# Patient Record
Sex: Female | Born: 1990 | Race: White | Hispanic: No | Marital: Single | Smoking: Current every day smoker
Health system: Southern US, Community
[De-identification: ages and names within clinical notes are randomized; demographics above are authoritative.]

## PROBLEM LIST (undated history)

## (undated) DIAGNOSIS — B1920 Unspecified viral hepatitis C without hepatic coma: Secondary | ICD-10-CM

## (undated) HISTORY — DX: Unspecified viral hepatitis C without hepatic coma: B19.20

---

## 2010-03-29 ENCOUNTER — Ambulatory Visit: Payer: Self-pay | Admitting: Gastroenterology

## 2010-05-17 ENCOUNTER — Ambulatory Visit: Payer: Self-pay | Admitting: Gastroenterology

## 2010-06-07 ENCOUNTER — Ambulatory Visit: Payer: Self-pay | Admitting: Gastroenterology

## 2010-06-28 ENCOUNTER — Ambulatory Visit: Payer: Self-pay | Admitting: Gastroenterology

## 2010-07-12 ENCOUNTER — Ambulatory Visit: Payer: Self-pay | Admitting: Gastroenterology

## 2010-08-09 ENCOUNTER — Ambulatory Visit: Payer: Self-pay | Admitting: Gastroenterology

## 2010-09-06 ENCOUNTER — Ambulatory Visit: Payer: Self-pay | Admitting: Gastroenterology

## 2010-10-04 ENCOUNTER — Ambulatory Visit: Payer: Self-pay | Admitting: Gastroenterology

## 2021-01-17 ENCOUNTER — Other Ambulatory Visit: Payer: Self-pay

## 2021-01-18 ENCOUNTER — Ambulatory Visit (INDEPENDENT_AMBULATORY_CARE_PROVIDER_SITE_OTHER): Payer: Medicaid Other | Admitting: Internal Medicine

## 2021-01-18 ENCOUNTER — Encounter: Payer: Self-pay | Admitting: Internal Medicine

## 2021-01-18 VITALS — BP 116/78 | HR 84 | Ht 64.0 in | Wt 116.2 lb

## 2021-01-18 DIAGNOSIS — E0590 Thyrotoxicosis, unspecified without thyrotoxic crisis or storm: Secondary | ICD-10-CM

## 2021-01-18 DIAGNOSIS — E0500 Thyrotoxicosis with diffuse goiter without thyrotoxic crisis or storm: Secondary | ICD-10-CM | POA: Diagnosis not present

## 2021-01-18 LAB — CBC WITH DIFFERENTIAL/PLATELET
Basophils Absolute: 0 10*3/uL (ref 0.0–0.1)
Basophils Relative: 0.5 % (ref 0.0–3.0)
Eosinophils Absolute: 0.1 10*3/uL (ref 0.0–0.7)
Eosinophils Relative: 1 % (ref 0.0–5.0)
HCT: 41.4 % (ref 36.0–46.0)
Hemoglobin: 14.1 g/dL (ref 12.0–15.0)
Lymphocytes Relative: 31.2 % (ref 12.0–46.0)
Lymphs Abs: 2.5 10*3/uL (ref 0.7–4.0)
MCHC: 34 g/dL (ref 30.0–36.0)
MCV: 86.2 fl (ref 78.0–100.0)
Monocytes Absolute: 0.5 10*3/uL (ref 0.1–1.0)
Monocytes Relative: 6 % (ref 3.0–12.0)
Neutro Abs: 5 10*3/uL (ref 1.4–7.7)
Neutrophils Relative %: 61.3 % (ref 43.0–77.0)
Platelets: 245 10*3/uL (ref 150.0–400.0)
RBC: 4.8 Mil/uL (ref 3.87–5.11)
RDW: 12.2 % (ref 11.5–15.5)
WBC: 8.1 10*3/uL (ref 4.0–10.5)

## 2021-01-18 LAB — COMPREHENSIVE METABOLIC PANEL
ALT: 17 U/L (ref 0–35)
AST: 15 U/L (ref 0–37)
Albumin: 4.4 g/dL (ref 3.5–5.2)
Alkaline Phosphatase: 65 U/L (ref 39–117)
BUN: 11 mg/dL (ref 6–23)
CO2: 28 mEq/L (ref 19–32)
Calcium: 9.1 mg/dL (ref 8.4–10.5)
Chloride: 106 mEq/L (ref 96–112)
Creatinine, Ser: 0.55 mg/dL (ref 0.40–1.20)
GFR: 122.2 mL/min (ref >60.00)
Glucose, Bld: 97 mg/dL (ref 70–99)
Potassium: 4.4 mEq/L (ref 3.5–5.1)
Sodium: 137 mEq/L (ref 135–145)
Total Bilirubin: 0.3 mg/dL (ref 0.2–1.2)
Total Protein: 7.1 g/dL (ref 6.0–8.3)

## 2021-01-18 LAB — T4, FREE: Free T4: 0.48 ng/dL — ABNORMAL LOW (ref 0.60–1.60)

## 2021-01-18 LAB — TSH: TSH: <0.01 u[IU]/mL — ABNORMAL LOW (ref 0.35–4.50)

## 2021-01-18 NOTE — Patient Instructions (Signed)
-   STOP PTU  - We recommend that you follow these hyperthyroidism instructions at home:  1) Take Methimazole   If you develop severe sore throat with high fevers OR develop unexplained yellowing of your skin, eyes, under your tongue, severe abdominal pain with nausea or vomiting --> then please get evaluated immediately.  2) Atenolol 25 mg daily

## 2021-01-18 NOTE — Progress Notes (Signed)
Name: Ariel Adams  MRN/ DOB: [id number], 04/04/1991    Age/ Sex: 30 y.o., female    PCP: Yvonne Kendall, NP   Reason for Endocrinology Evaluation: Hyperthyroidism     Date of Initial Endocrinology Evaluation: 01/18/2021     HPI: Ariel Adams is a 30 y.o. female with a past medical history of Graves' Disease . The patient presented for initial endocrinology clinic visit on 01/18/2021 for consultative assistance with her hyperthyroidism.   She has been diagnosed with hyperthyroidism secondary to Graves' disease in 02/2020. She presented with thyromegaly as well as eye symptoms with bulging  As well as weight loss.   She was started on PTU in 02/2020 .   Had ectopic pregnancy prior to her diagnosis    LMP 01/15/2021 - its regular   Today she endorses stable weight Palpitations have resolved  Has tremors   Neck swelling has decreased since being on PTU    Has strong FH of thyroid disease ( maternal grand mother)      HOME ENDOCRINE MEDICATIONS: PTU 50 mg , two tabs TID - has been taking 2 tabs BID  Atenolol 25 mg daily      HISTORY:  Past Medical History:  Past Medical History:  Diagnosis Date   Hepatitis C    10 years ago    Past Surgical History:    Social History:  reports that she has been smoking cigarettes. She has never used smokeless tobacco. She reports previous alcohol use.  Family History: family history is not on file.   HOME MEDICATIONS: Allergies as of 01/18/2021   No Known Allergies     Medication List       Accurate as of January 18, 2021 11:13 AM. If you have any questions, ask your nurse or doctor.        atenolol 25 MG tablet Commonly known as: TENORMIN TAKE 1 TABLET BY MOUTH ONCE DAILY FOR HEART RATE   propylthiouracil 50 MG tablet Commonly known as: PTU Take 100 mg by mouth 3 (three) times daily.         REVIEW OF SYSTEMS: A comprehensive ROS was conducted with the patient and is negative except as per HPI      OBJECTIVE:  VS: BP 116/78    Pulse 84    Ht 5\' 4"  (1.626 m)    Wt 116 lb 4 oz (52.7 kg)    LMP 01/18/2021    SpO2 99%    BMI 19.95 kg/m    Wt Readings from Last 3 Encounters:  01/18/21 116 lb 4 oz (52.7 kg)     EXAM: General: Pt appears well and is in NAD  Eyes: External eye exam normal without stare, lid lag or exophthalmos.  EOM intact.  PERRL.  Neck: General: Supple without adenopathy. Thyroid: Thyroid size enlarged ~70 grams . +  thyroid bruit.  Lungs: Clear with good BS bilat with no rales, rhonchi, or wheezes  Heart: Auscultation: RRR.  Abdomen: Normoactive bowel sounds, soft, nontender, without masses or organomegaly palpable  Extremities:  BL LE: No pretibial edema normal ROM and strength.  Skin: Hair: Texture and amount normal with gender appropriate distribution Skin Inspection: No rashes Skin Palpation: Skin temperature, texture, and thickness normal to palpation  Neuro: Cranial nerves: II - XII grossly intact  Motor: Normal strength throughout DTRs: 2+ and symmetric in UE without delay in relaxation phase  Mental Status: Judgment, insight: Intact Orientation: Oriented to time, place, and person Mood and  affect: No depression, anxiety, or agitation     DATA REVIEWED:   Results for LEILANNI, HALVORSON (MRN [id number]) as of 01/19/2021 12:55  Ref. Range 01/18/2021 11:24  Sodium Latest Ref Range: 135 - 145 mEq/L 137  Potassium Latest Ref Range: 3.5 - 5.1 mEq/L 4.4  Chloride Latest Ref Range: 96 - 112 mEq/L 106  CO2 Latest Ref Range: 19 - 32 mEq/L 28  Glucose Latest Ref Range: 70 - 99 mg/dL 97  BUN Latest Ref Range: 6 - 23 mg/dL 11  Creatinine Latest Ref Range: 0.40 - 1.20 mg/dL 9.37  Calcium Latest Ref Range: 8.4 - 10.5 mg/dL 9.1  Alkaline Phosphatase Latest Ref Range: 39 - 117 U/L 65  Albumin Latest Ref Range: 3.5 - 5.2 g/dL 4.4  AST Latest Ref Range: 0 - 37 U/L 15  ALT Latest Ref Range: 0 - 35 U/L 17  Total Protein Latest Ref Range: 6.0 - 8.3 g/dL 7.1  Total  Bilirubin Latest Ref Range: 0.2 - 1.2 mg/dL 0.3  GFR Latest Ref Range: >60.00 mL/min 122.20  WBC Latest Ref Range: 4.0 - 10.5 K/uL 8.1  RBC Latest Ref Range: 3.87 - 5.11 Mil/uL 4.80  Hemoglobin Latest Ref Range: 12.0 - 15.0 g/dL 90.2  HCT Latest Ref Range: 36.0 - 46.0 % 41.4  MCV Latest Ref Range: 78.0 - 100.0 fl 86.2  MCHC Latest Ref Range: 30.0 - 36.0 g/dL 40.9  RDW Latest Ref Range: 11.5 - 15.5 % 12.2  Platelets Latest Ref Range: 150.0 - 400.0 K/uL 245.0  Neutrophils Latest Ref Range: 43.0 - 77.0 % 61.3  Lymphocytes Latest Ref Range: 12.0 - 46.0 % 31.2  Monocytes Relative Latest Ref Range: 3.0 - 12.0 % 6.0  Eosinophil Latest Ref Range: 0.0 - 5.0 % 1.0  Basophil Latest Ref Range: 0.0 - 3.0 % 0.5  NEUT# Latest Ref Range: 1.4 - 7.7 K/uL 5.0  Lymphocyte # Latest Ref Range: 0.7 - 4.0 K/uL 2.5  Monocyte # Latest Ref Range: 0.1 - 1.0 K/uL 0.5  Eosinophils Absolute Latest Ref Range: 0.0 - 0.7 K/uL 0.1  Basophils Absolute Latest Ref Range: 0.0 - 0.1 K/uL 0.0  TSH Latest Ref Range: 0.35 - 4.50 uIU/mL <0.01 Repeated and verified X2. (L)  T4,Free(Direct) Latest Ref Range: 0.60 - 1.60 ng/dL 7.35 (L)    ASSESSMENT/PLAN/RECOMMENDATIONS:   1. Hyperthyroidism Secondary to Graves' Disease   -- Historically there has been imperfect adherence to thionamide therapy  - We discussed that Graves' Disease is a result of an autoimmune condition involving the thyroid.    - We discussed with pt the benefits of methimazole in the Tx of hyperthyroidism, as well as the possible side effects/complications of anti-thyroid drug Tx (specifically detailing the rare, but serious side effect of agranulocytosis). She was informed of need for regular thyroid function monitoring while on methimazole to ensure appropriate dosage without over-treatment. As well, we discussed the possible side effects of methimazole including the chance of rash, the small chance of liver irritation/juandice and the <=1 in 300-400 chance of  sudden onset agranulocytosis.  We discussed importance of going to ED promptly (and stopping methimazole) if shewere to develop significant fever with severe sore throat of other evidence of acute infection.      We extensively discussed the various treatment options for hyperthyroidism and Graves disease including ablation therapy with radioactive iodine versus antithyroid drug treatment versus surgical therapy.   We discussed the various possible benefits versus side effects of the various therapies.   I carefully explained  to the patient that one of the consequences of I-131 ablation treatment would likely be permanent hypothyroidism which would require long-term replacement therapy with LT4.   - At this time the pt is interested in pursing thioamide therapy, will switch to Methimazole , she understands PTU is preferred during first trimeter of pregnancy but since she is not planning on conceiving , will switch . I have advised her to use OCP's to avoid pregnancy at this time      Medications : STOP PTU  Start Methimazole 10 mg BID     2. Grave's Disease:   - No extra-thyroidal manifestations of graves' disease    3. Tobacco Abuse:   - Counseled about the importance of smoking cessation as tobacco use decreased her chances of going to remission and increased her risk of developing Graves' orbitopathy    F/U in 3 months  Signed electronically by: Lyndle Herrlich, MD  [facility] Endocrinology  [facility] [address] [place] [place], [place] [address] Phone: [phone] FAX: [phone]   CC: Yvonne Kendall, NP [address] [place] [address] Phone: [phone] Fax: [phone]   Return to Endocrinology clinic as below: No future appointments.

## 2021-01-19 DIAGNOSIS — E0590 Thyrotoxicosis, unspecified without thyrotoxic crisis or storm: Secondary | ICD-10-CM | POA: Insufficient documentation

## 2021-01-19 DIAGNOSIS — E0500 Thyrotoxicosis with diffuse goiter without thyrotoxic crisis or storm: Secondary | ICD-10-CM | POA: Insufficient documentation

## 2021-01-19 MED ORDER — METHIMAZOLE 10 MG PO TABS: 10.0000 mg | ORAL_TABLET | Freq: Two times a day (BID) | ORAL | 6 refills | Status: DC

## 2021-01-21 LAB — TRAB (TSH RECEPTOR BINDING ANTIBODY): TRAB: 24.64 IU/L — ABNORMAL HIGH (ref <=2.00)

## 2021-03-19 ENCOUNTER — Encounter: Payer: Self-pay | Admitting: Internal Medicine

## 2021-03-20 NOTE — Telephone Encounter (Signed)
Pt called regarding the MyChart message below. She thinks the Dr misunderstood her as she has been taking both of those medications for a while. Since she hasn't had any refills she hasn't been able to take them and she can tell a difference in how she feels. She also states since she hasn't taken them her heart rate has been averaging around 100-102 so she prefers not to stop any of those medications. She would like a refill for her atenolol and metformin. Please send to:  [facility] [address], [place] - [address] Phone:  [phone]  Fax:  [phone]     Ph# [phone]

## 2021-03-23 NOTE — Telephone Encounter (Signed)
Pt called back again regarding the notes below. She is trying to get a refill on her medication, she has been out for a week and is starting to get concerned whether she should go to her PCP for this.  [facility] [address] [place] - [address] Phone:  [phone]  Fax:  [phone]

## 2021-03-26 ENCOUNTER — Telehealth: Payer: Self-pay

## 2021-03-26 NOTE — Telephone Encounter (Signed)
Dr Lonzo Cloud stated that patient will need an appointment.

## 2021-03-26 NOTE — Telephone Encounter (Signed)
Patient is very unhappy that provider will not refill her medication, she does not agree with the provider saying she needs to stop taking the medication. States her HR is going through the roof. She doesn't agree one bit and is very upset and states she will be complaining to higher up as well as she is no longer coming back     Please advise    Medication : atenolol (TENORMIN) 25 MG tablet

## 2021-04-19 ENCOUNTER — Encounter: Payer: Self-pay | Admitting: Internal Medicine

## 2021-04-19 ENCOUNTER — Other Ambulatory Visit: Payer: Self-pay

## 2021-04-19 ENCOUNTER — Ambulatory Visit (INDEPENDENT_AMBULATORY_CARE_PROVIDER_SITE_OTHER): Payer: Medicaid Other | Admitting: Internal Medicine

## 2021-04-19 VITALS — BP 126/82 | HR 83 | Ht 64.0 in | Wt 123.1 lb

## 2021-04-19 DIAGNOSIS — E0500 Thyrotoxicosis with diffuse goiter without thyrotoxic crisis or storm: Secondary | ICD-10-CM | POA: Diagnosis not present

## 2021-04-19 DIAGNOSIS — E0590 Thyrotoxicosis, unspecified without thyrotoxic crisis or storm: Secondary | ICD-10-CM

## 2021-04-19 LAB — COMPREHENSIVE METABOLIC PANEL
ALT: 17 U/L (ref 0–35)
AST: 18 U/L (ref 0–37)
Albumin: 4.4 g/dL (ref 3.5–5.2)
Alkaline Phosphatase: 70 U/L (ref 39–117)
BUN: 9 mg/dL (ref 6–23)
CO2: 27 mEq/L (ref 19–32)
Calcium: 9.2 mg/dL (ref 8.4–10.5)
Chloride: 103 mEq/L (ref 96–112)
Creatinine, Ser: 0.79 mg/dL (ref 0.40–1.20)
GFR: 99.55 mL/min (ref >60.00)
Glucose, Bld: 71 mg/dL (ref 70–99)
Potassium: 4.1 mEq/L (ref 3.5–5.1)
Sodium: 137 mEq/L (ref 135–145)
Total Bilirubin: 0.3 mg/dL (ref 0.2–1.2)
Total Protein: 7.4 g/dL (ref 6.0–8.3)

## 2021-04-19 LAB — CBC WITH DIFFERENTIAL/PLATELET
Basophils Absolute: 0 10*3/uL (ref 0.0–0.1)
Basophils Relative: 0.5 % (ref 0.0–3.0)
Eosinophils Absolute: 0.1 10*3/uL (ref 0.0–0.7)
Eosinophils Relative: 1.6 % (ref 0.0–5.0)
HCT: 42.5 % (ref 36.0–46.0)
Hemoglobin: 14.6 g/dL (ref 12.0–15.0)
Lymphocytes Relative: 37 % (ref 12.0–46.0)
Lymphs Abs: 3.1 10*3/uL (ref 0.7–4.0)
MCHC: 34.4 g/dL (ref 30.0–36.0)
MCV: 88.7 fl (ref 78.0–100.0)
Monocytes Absolute: 0.4 10*3/uL (ref 0.1–1.0)
Monocytes Relative: 4.4 % (ref 3.0–12.0)
Neutro Abs: 4.7 10*3/uL (ref 1.4–7.7)
Neutrophils Relative %: 56.5 % (ref 43.0–77.0)
Platelets: 288 10*3/uL (ref 150.0–400.0)
RBC: 4.79 Mil/uL (ref 3.87–5.11)
RDW: 14.2 % (ref 11.5–15.5)
WBC: 8.3 10*3/uL (ref 4.0–10.5)

## 2021-04-19 LAB — TSH: TSH: 9.88 u[IU]/mL — ABNORMAL HIGH (ref 0.35–4.50)

## 2021-04-19 LAB — T4, FREE: Free T4: 0.21 ng/dL — ABNORMAL LOW (ref 0.60–1.60)

## 2021-04-19 NOTE — Patient Instructions (Signed)
-   We recommend that you follow these hyperthyroidism instructions at home:  1) Take Methimazole 10 mg TWO tablets daily for now   If you develop severe sore throat with high fevers OR develop unexplained yellowing of your skin, eyes, under your tongue, severe abdominal pain with nausea or vomiting --> then please get evaluated immediately.

## 2021-04-19 NOTE — Progress Notes (Signed)
Name: Ariel Adams  MRN/ DOB: [id number], 09-11-1991    Age/ Sex: 30 y.o., female     PCP: Yvonne Kendall, NP   Reason for Endocrinology Evaluation: Hyperthyroidism     Initial Endocrinology Clinic Visit: 01/18/2021    PATIENT IDENTIFIER: Ariel Adams is a 30 y.o., female with a past medical history of Graves' disease. She has followed with  Endocrinology clinic since 01/18/2021 for consultative assistance with management of her Graves' disease    HISTORICAL SUMMARY:  She has been diagnosed with hyperthyroidism secondary to Graves' disease in 02/2020. She presented with thyromegaly as well as eye symptoms with bulging  As well as weight loss.   She was started on PTU in 02/2020 but was switched to methimazole by  12/2020 with the understanding that she will not be conceiving  Had ectopic pregnancy prior to her diagnosis   Has strong FH of thyroid disease ( maternal grand mother)   SUBJECTIVE:    Today (04/19/2021):  [name] is here for her hyperthyroidism.    She has been on methimazole  , she is trying to get better with compliance  She has been noted with weight gain  Denies constipation  Left side of the neck decreased in size   Denies burning or itching in the eyes  Using condoms   LMP 2 weeks , regular     Methimazole 10 mg BID     HISTORY:  Past Medical History:  Past Medical History:  Diagnosis Date  . Hepatitis C    10 years ago    Past Surgical History:   Social History:  reports that she has been smoking cigarettes. She has never used smokeless tobacco. She reports previous alcohol use. No history on file for drug use.  Family History: No family history on file.   HOME MEDICATIONS: Allergies as of 04/19/2021   No Known Allergies     Medication List       Accurate as of April 19, 2021  9:36 AM. If you have any questions, ask your nurse or doctor.        atenolol 25 MG tablet Commonly known as: TENORMIN TAKE 1 TABLET BY MOUTH  ONCE DAILY FOR HEART RATE   methimazole 10 MG tablet Commonly known as: TAPAZOLE Take 1 tablet (10 mg total) by mouth 2 (two) times daily.         OBJECTIVE:   PHYSICAL EXAM: VS: BP 126/82   Pulse 83   Ht 5\' 4"  (1.626 m)   Wt 123 lb 2 oz (55.8 kg)   LMP 04/01/2021   SpO2 99%   BMI 21.13 kg/m    EXAM: General: Pt appears well and is in NAD  Neck: General: Supple without adenopathy. Thyroid: Thyroid size enlarged ~ 80 grams , + B/L thyroid bruit.  Lungs: Clear with good BS bilat with no rales, rhonchi, or wheezes  Heart: Auscultation: RRR.  Abdomen: Normoactive bowel sounds, soft, nontender, without masses or organomegaly palpable  Extremities:  BL LE: No pretibial edema normal ROM and strength.  Skin: Hair: Texture and amount normal with gender appropriate distribution Skin Inspection: No rashes Skin Palpation: Skin temperature, texture, and thickness normal to palpation  Neuro: Cranial nerves: II - XII grossly intact  DTRs: 2+ and symmetric in UE without delay in relaxation phase  Mental Status: Judgment, insight: Intact Orientation: Oriented to time, place, and person Mood and affect: No depression, anxiety, or agitation     DATA REVIEWED: Results for  CARIZMA, DUNSWORTH (MRN [id number]) as of 04/20/2021 07:00  Ref. Range 04/19/2021 09:46  Sodium Latest Ref Range: 135 - 145 mEq/L 137  Potassium Latest Ref Range: 3.5 - 5.1 mEq/L 4.1  Chloride Latest Ref Range: 96 - 112 mEq/L 103  CO2 Latest Ref Range: 19 - 32 mEq/L 27  Glucose Latest Ref Range: 70 - 99 mg/dL 71  BUN Latest Ref Range: 6 - 23 mg/dL 9  Creatinine Latest Ref Range: 0.40 - 1.20 mg/dL 8.93  Calcium Latest Ref Range: 8.4 - 10.5 mg/dL 9.2  Alkaline Phosphatase Latest Ref Range: 39 - 117 U/L 70  Albumin Latest Ref Range: 3.5 - 5.2 g/dL 4.4  AST Latest Ref Range: 0 - 37 U/L 18  ALT Latest Ref Range: 0 - 35 U/L 17  Total Protein Latest Ref Range: 6.0 - 8.3 g/dL 7.4  Total Bilirubin Latest Ref Range: 0.2 - 1.2  mg/dL 0.3  GFR Latest Ref Range: >60.00 mL/min 99.55  WBC Latest Ref Range: 4.0 - 10.5 K/uL 8.3  RBC Latest Ref Range: 3.87 - 5.11 Mil/uL 4.79  Hemoglobin Latest Ref Range: 12.0 - 15.0 g/dL 73.4  HCT Latest Ref Range: 36.0 - 46.0 % 42.5  MCV Latest Ref Range: 78.0 - 100.0 fl 88.7  MCHC Latest Ref Range: 30.0 - 36.0 g/dL 28.7  RDW Latest Ref Range: 11.5 - 15.5 % 14.2  Platelets Latest Ref Range: 150.0 - 400.0 K/uL 288.0  Neutrophils Latest Ref Range: 43.0 - 77.0 % 56.5  Lymphocytes Latest Ref Range: 12.0 - 46.0 % 37.0  Monocytes Relative Latest Ref Range: 3.0 - 12.0 % 4.4  Eosinophil Latest Ref Range: 0.0 - 5.0 % 1.6  Basophil Latest Ref Range: 0.0 - 3.0 % 0.5  NEUT# Latest Ref Range: 1.4 - 7.7 K/uL 4.7  Lymphocyte # Latest Ref Range: 0.7 - 4.0 K/uL 3.1  Monocyte # Latest Ref Range: 0.1 - 1.0 K/uL 0.4  Eosinophils Absolute Latest Ref Range: 0.0 - 0.7 K/uL 0.1  Basophils Absolute Latest Ref Range: 0.0 - 0.1 K/uL 0.0  TSH Latest Ref Range: 0.35 - 4.50 uIU/mL 9.88 (H)  T4,Free(Direct) Latest Ref Range: 0.60 - 1.60 ng/dL 6.81 (L)    TRAB <=1.57 IU/L 24.64High     ASSESSMENT / PLAN / RECOMMENDATIONS:   1. 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 Graves' Disease:   - No extra-thyroidal manifestations of Graves disease    F/U in 3 months     Signed electronically by: Lyndle Herrlich, MD  [facility] Endocrinology  [facility] [address] [place], [place] [address] Phone: [phone] FAX: [phone]      CC: Yvonne Kendall, NP [address] [place] [address] Phone: [phone]  Fax: [phone]   Return to Endocrinology clinic  as below: No future appointments.

## 2021-04-20 ENCOUNTER — Encounter: Payer: Self-pay | Admitting: Internal Medicine

## 2021-04-20 MED ORDER — METHIMAZOLE 10 MG PO TABS: 10.0000 mg | ORAL_TABLET | Freq: Every day | ORAL | 1 refills | Status: DC

## 2021-07-20 ENCOUNTER — Ambulatory Visit: Payer: Medicaid Other | Admitting: Internal Medicine

## 2021-07-25 ENCOUNTER — Ambulatory Visit: Payer: Medicaid Other | Admitting: Internal Medicine

## 2021-07-25 NOTE — Progress Notes (Deleted)
Name: Ariel Adams  MRN/ DOB: [id number], April 12, 1991    Age/ Sex: 30 y.o., female     PCP: Yvonne Kendall, NP   Reason for Endocrinology Evaluation: Hyperthyroidism     Initial Endocrinology Clinic Visit: 01/18/2021    PATIENT IDENTIFIER: Ariel Adams is a 30 y.o., female with a past medical history of Graves' disease. She has followed with [facility] Endocrinology clinic since 01/18/2021 for consultative assistance with management of her Graves' disease    HISTORICAL SUMMARY:  She has been diagnosed with hyperthyroidism secondary to Graves' disease in 02/2020. She presented with thyromegaly as well as eye symptoms with bulging  As well as weight loss.    She was started on PTU in 02/2020 but was switched to methimazole by  12/2020 with the understanding that she will not be conceiving   Had ectopic pregnancy prior to her diagnosis   Has strong FH of thyroid disease ( maternal grand mother)    SUBJECTIVE:    Today (07/25/2021):  Ariel Adams is here for her hyperthyroidism.     She has been noted with weight gain  Denies constipation    Denies burning or itching in the eyes    LMP     Methimazole 10 mg, 1 tablet daily     HISTORY:  Past Medical History:  Past Medical History:  Diagnosis Date   Hepatitis C    10 years ago   Past Surgical History:  Social History:  reports that she has been smoking cigarettes. She has never used smokeless tobacco. She reports previous alcohol use. No history on file for drug use. Family History: No family history on file.   HOME MEDICATIONS: Allergies as of 07/25/2021   No Known Allergies      Medication List        Accurate as of July 25, 2021  7:19 AM. If you have any questions, ask your nurse or doctor.          methimazole 10 MG tablet Commonly known as: TAPAZOLE Take 1 tablet (10 mg total) by mouth daily.          OBJECTIVE:   PHYSICAL EXAM: VS: There were no vitals taken for this visit.    EXAM: General: Pt appears well and is in NAD  Neck: General: Supple without adenopathy. Thyroid: Thyroid size enlarged ~ 80 grams , + B/L thyroid bruit.  Lungs: Clear with good BS bilat with no rales, rhonchi, or wheezes  Heart: Auscultation: RRR.  Abdomen: Normoactive bowel sounds, soft, nontender, without masses or organomegaly palpable  Extremities:  BL LE: No pretibial edema normal ROM and strength.  Skin: Hair: Texture and amount normal with gender appropriate distribution Skin Inspection: No rashes Skin Palpation: Skin temperature, texture, and thickness normal to palpation  Neuro: Cranial nerves: II - XII grossly intact  DTRs: 2+ and symmetric in UE without delay in relaxation phase  Mental Status: Judgment, insight: Intact Orientation: Oriented to time, place, and person Mood and affect: No depression, anxiety, or agitation     DATA REVIEWED: Results for Ariel Adams, Ariel Adams (MRN [id number]) as of 04/20/2021 07:00  Ref. Range 04/19/2021 09:46  Sodium Latest Ref Range: 135 - 145 mEq/L 137  Potassium Latest Ref Range: 3.5 - 5.1 mEq/L 4.1  Chloride Latest Ref Range: 96 - 112 mEq/L 103  CO2 Latest Ref Range: 19 - 32 mEq/L 27  Glucose Latest Ref Range: 70 - 99 mg/dL 71  BUN Latest Ref Range: 6 - 23 mg/dL 9  Creatinine Latest Ref Range: 0.40 - 1.20 mg/dL 8.14  Calcium Latest Ref Range: 8.4 - 10.5 mg/dL 9.2  Alkaline Phosphatase Latest Ref Range: 39 - 117 U/L 70  Albumin Latest Ref Range: 3.5 - 5.2 g/dL 4.4  AST Latest Ref Range: 0 - 37 U/L 18  ALT Latest Ref Range: 0 - 35 U/L 17  Total Protein Latest Ref Range: 6.0 - 8.3 g/dL 7.4  Total Bilirubin Latest Ref Range: 0.2 - 1.2 mg/dL 0.3  GFR Latest Ref Range: >60.00 mL/min 99.55  WBC Latest Ref Range: 4.0 - 10.5 K/uL 8.3  RBC Latest Ref Range: 3.87 - 5.11 Mil/uL 4.79  Hemoglobin Latest Ref Range: 12.0 - 15.0 g/dL 48.1  HCT Latest Ref Range: 36.0 - 46.0 % 42.5  MCV Latest Ref Range: 78.0 - 100.0 fl 88.7  MCHC Latest Ref Range: 30.0  - 36.0 g/dL 85.6  RDW Latest Ref Range: 11.5 - 15.5 % 14.2  Platelets Latest Ref Range: 150.0 - 400.0 K/uL 288.0  Neutrophils Latest Ref Range: 43.0 - 77.0 % 56.5  Lymphocytes Latest Ref Range: 12.0 - 46.0 % 37.0  Monocytes Relative Latest Ref Range: 3.0 - 12.0 % 4.4  Eosinophil Latest Ref Range: 0.0 - 5.0 % 1.6  Basophil Latest Ref Range: 0.0 - 3.0 % 0.5  NEUT# Latest Ref Range: 1.4 - 7.7 K/uL 4.7  Lymphocyte # Latest Ref Range: 0.7 - 4.0 K/uL 3.1  Monocyte # Latest Ref Range: 0.1 - 1.0 K/uL 0.4  Eosinophils Absolute Latest Ref Range: 0.0 - 0.7 K/uL 0.1  Basophils Absolute Latest Ref Range: 0.0 - 0.1 K/uL 0.0  TSH Latest Ref Range: 0.35 - 4.50 uIU/mL 9.88 (H)  T4,Free(Direct) Latest Ref Range: 0.60 - 1.60 ng/dL 3.14 (L)    TRAB <=9.70 IU/L 24.64 High      ASSESSMENT / PLAN / RECOMMENDATIONS:   Hyperthyroidism Secondary to Graves' Disease :  - Pt is clinically euthyroid  - 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 Graves' Disease:   - No extra-thyroidal manifestations of Graves disease    F/U in 3 months     Signed electronically by: Lyndle Herrlich, MD  [facility] Endocrinology  [facility] [address] [place], [place] [address] Phone: [phone] FAX: [phone]      CC: Yvonne Kendall, NP [address] [place] [address] Phone: [phone]  Fax: [phone]   Return to Endocrinology clinic as below: Future Appointments  Date Time Provider Department Center  07/25/2021  9:10 AM Ariel Adams, Konrad Dolores, MD [id number] None

## 2021-10-01 ENCOUNTER — Other Ambulatory Visit: Payer: Self-pay

## 2021-10-01 ENCOUNTER — Encounter: Payer: Self-pay | Admitting: Internal Medicine

## 2021-10-01 ENCOUNTER — Ambulatory Visit (INDEPENDENT_AMBULATORY_CARE_PROVIDER_SITE_OTHER): Payer: Medicaid Other | Admitting: Internal Medicine

## 2021-10-01 VITALS — BP 110/64 | HR 64 | Ht 64.0 in | Wt 114.2 lb

## 2021-10-01 DIAGNOSIS — E0500 Thyrotoxicosis with diffuse goiter without thyrotoxic crisis or storm: Secondary | ICD-10-CM | POA: Diagnosis not present

## 2021-10-01 DIAGNOSIS — E0590 Thyrotoxicosis, unspecified without thyrotoxic crisis or storm: Secondary | ICD-10-CM

## 2021-10-01 LAB — TSH: TSH: 0 u[IU]/mL — ABNORMAL LOW (ref 0.35–5.50)

## 2021-10-01 LAB — T4, FREE: Free T4: 2.74 ng/dL — ABNORMAL HIGH (ref 0.60–1.60)

## 2021-10-01 NOTE — Progress Notes (Signed)
Name: Ariel Adams  MRN/ DOB: [id number], May 12, 1991    Age/ Sex: 30 y.o., female     PCP: Yvonne Kendall, NP   Reason for Endocrinology Evaluation: Hyperthyroidism     Initial Endocrinology Clinic Visit: 01/18/2021    PATIENT IDENTIFIER: Ariel Adams is a 29 y.o., female with a past medical history of Graves' disease. She has followed with [facility] since 01/18/2021 for consultative assistance with management of her Graves' disease    HISTORICAL SUMMARY:  She has been diagnosed with hyperthyroidism secondary to Graves' disease in 02/2020. She presented with thyromegaly as well as eye symptoms with bulging  As well as weight loss.    She was started on PTU in 02/2020 but was switched to methimazole by  12/2020 with the understanding that she will not be conceiving   Had ectopic pregnancy prior to her diagnosis   Has strong FH of thyroid disease ( maternal grand mother)    SUBJECTIVE:    Today (10/01/2021):  [name] is here for her hyperthyroidism secondary to Graves' disease.    She was on methimazole 10 mg daily, but the patient states that after missing her appointments in July she had self reduced methimazole to three quarters of a tablet, because she did not want to be on too much.   She has been noted with weight loss Denies constipation , diarrhea  Neck size decreasing  Her eyes are slightly bulging but not painful   LMP early September, regular  Uses condoms.       Methimazole 10 mg daily     HISTORY:  Past Medical History:  Past Medical History:  Diagnosis Date   Hepatitis C    10 years ago   Past Surgical History:  Social History:  reports that she has been smoking cigarettes. She has never used smokeless tobacco. She reports that she does not currently use alcohol. No history on file for drug use. Family History: No family history on file.   HOME MEDICATIONS: Allergies as of 10/01/2021   No Known Allergies      Medication  List        Accurate as of October 01, 2021 12:39 PM. If you have any questions, ask your nurse or doctor.          methimazole 10 MG tablet Commonly known as: TAPAZOLE Take 1 tablet (10 mg total) by mouth daily.          OBJECTIVE:   PHYSICAL EXAM: VS: BP 110/64 (BP Location: Left Arm, Patient Position: Sitting, Cuff Size: Small)   Pulse 64   Ht 5\' 4"  (1.626 m)   Wt 114 lb 3.2 oz (51.8 kg)   SpO2 99%   BMI 19.60 kg/m    EXAM: General: Pt appears well and is in NAD  Neck: General: Supple without adenopathy. Thyroid: Thyroid size enlarged ~ 60 grams , no thyroid bruit.  Lungs: Clear with good BS bilat with no rales, rhonchi, or wheezes  Heart: Auscultation: RRR.  Abdomen: Normoactive bowel sounds, soft, nontender, without masses or organomegaly palpable  Extremities:  BL LE: No pretibial edema normal ROM and strength.  Mental Status: Judgment, insight: Intact Orientation: Oriented to time, place, and person Mood and affect: No depression, anxiety, or agitation     DATA REVIEWED:  Results for SHIRELLE, TOOTLE (MRN [place] [facility]) as of 10/02/2021 08:51  Ref. Range 10/01/2021 11:57  TSH Latest Ref Range: 0.35 - 5.50 uIU/mL 0.00 Repeated and verified X2. (L)  T4,Free(Direct) Latest Ref Range: 0.60 - 1.60 ng/dL 6.33 (H)     Results for AMYRIAH, BURAS (MRN [id number]) as of 04/20/2021 07:00  Ref. Range 04/19/2021 09:46  Sodium Latest Ref Range: 135 - 145 mEq/L 137  Potassium Latest Ref Range: 3.5 - 5.1 mEq/L 4.1  Chloride Latest Ref Range: 96 - 112 mEq/L 103  CO2 Latest Ref Range: 19 - 32 mEq/L 27  Glucose Latest Ref Range: 70 - 99 mg/dL 71  BUN Latest Ref Range: 6 - 23 mg/dL 9  Creatinine Latest Ref Range: 0.40 - 1.20 mg/dL 8.93  Calcium Latest Ref Range: 8.4 - 10.5 mg/dL 9.2  Alkaline Phosphatase Latest Ref Range: 39 - 117 U/L 70  Albumin Latest Ref Range: 3.5 - 5.2 g/dL 4.4  AST Latest Ref Range: 0 - 37 U/L 18  ALT Latest Ref Range: 0 - 35 U/L 17  Total Protein  Latest Ref Range: 6.0 - 8.3 g/dL 7.4  Total Bilirubin Latest Ref Range: 0.2 - 1.2 mg/dL 0.3  GFR Latest Ref Range: >60.00 mL/min 99.55  WBC Latest Ref Range: 4.0 - 10.5 K/uL 8.3  RBC Latest Ref Range: 3.87 - 5.11 Mil/uL 4.79  Hemoglobin Latest Ref Range: 12.0 - 15.0 g/dL 73.4  HCT Latest Ref Range: 36.0 - 46.0 % 42.5  MCV Latest Ref Range: 78.0 - 100.0 fl 88.7  MCHC Latest Ref Range: 30.0 - 36.0 g/dL 28.7  RDW Latest Ref Range: 11.5 - 15.5 % 14.2  Platelets Latest Ref Range: 150.0 - 400.0 K/uL 288.0  Neutrophils Latest Ref Range: 43.0 - 77.0 % 56.5  Lymphocytes Latest Ref Range: 12.0 - 46.0 % 37.0  Monocytes Relative Latest Ref Range: 3.0 - 12.0 % 4.4  Eosinophil Latest Ref Range: 0.0 - 5.0 % 1.6  Basophil Latest Ref Range: 0.0 - 3.0 % 0.5  NEUT# Latest Ref Range: 1.4 - 7.7 K/uL 4.7  Lymphocyte # Latest Ref Range: 0.7 - 4.0 K/uL 3.1  Monocyte # Latest Ref Range: 0.1 - 1.0 K/uL 0.4  Eosinophils Absolute Latest Ref Range: 0.0 - 0.7 K/uL 0.1  Basophils Absolute Latest Ref Range: 0.0 - 0.1 K/uL 0.0    TRAB <=2.00 IU/L 24.64 High      ASSESSMENT / PLAN / RECOMMENDATIONS:   Hyperthyroidism Secondary to Graves' Disease :  - Pt is clinically euthyroid  - She continues to have goiter but without bruits this time - She continues with imperfect adherence at times . She has self reduced her methimazole dose from 1 tablet a day to ? 3/4th of a tablet  - She is biochemically hyperthyroid again, will increase methimazole as below  -She understands that PTU is the recommended medication during first trimester of pregnancy, she was advised to stop methimazole should she test positive and inform me so I can switch to PTU -She is not a candidate for radioactive iodine ablation due to Graves' orbitopathy, we discussed continuing thionamides therapy versus total thyroidectomy.  She has entertained the idea of total thyroidectomy but has not made a definite decision yet -She was given a printed lab  order to be taken to [facility] when instructed, patient lives in [place]  Medications  Increase Methimazole 10 mg,to one and a half  tabs daily    2. Graves' Disease:    -Patient has mild Graves' disease orbitopathy, patient encouraged to establish with ophthalmology   F/U in 4 months     Signed electronically by: Lyndle Herrlich, MD   Endocrinology  [facility] [phone]  [address] [place], [place] [address] Phone: [phone] FAX: [phone]      CC: Yvonne Kendall, NP [address] [place] [address] Phone: [phone]  Fax: [phone]   Return to Endocrinology clinic as below: Future Appointments  Date Time Provider Department Center  02/04/2022  9:10 AM Melisa Donofrio, Konrad Dolores, MD [facility] None

## 2021-10-02 MED ORDER — METHIMAZOLE 10 MG PO TABS: 15.0000 mg | ORAL_TABLET | Freq: Every day | ORAL | 1 refills | Status: DC

## 2022-02-04 ENCOUNTER — Other Ambulatory Visit: Payer: Self-pay

## 2022-02-04 ENCOUNTER — Ambulatory Visit (INDEPENDENT_AMBULATORY_CARE_PROVIDER_SITE_OTHER): Payer: Medicaid Other | Admitting: Internal Medicine

## 2022-02-04 ENCOUNTER — Encounter: Payer: Self-pay | Admitting: Internal Medicine

## 2022-02-04 VITALS — BP 112/68 | HR 86 | Ht 64.0 in | Wt 122.6 lb

## 2022-02-04 DIAGNOSIS — E0500 Thyrotoxicosis with diffuse goiter without thyrotoxic crisis or storm: Secondary | ICD-10-CM

## 2022-02-04 DIAGNOSIS — E0590 Thyrotoxicosis, unspecified without thyrotoxic crisis or storm: Secondary | ICD-10-CM | POA: Diagnosis not present

## 2022-02-04 LAB — TSH: TSH: 0 u[IU]/mL — ABNORMAL LOW (ref 0.35–5.50)

## 2022-02-04 LAB — T3: T3, Total: 290 ng/dL — ABNORMAL HIGH (ref 76–181)

## 2022-02-04 LAB — T4, FREE: Free T4: 2.07 ng/dL — ABNORMAL HIGH (ref 0.60–1.60)

## 2022-02-04 MED ORDER — METHIMAZOLE 10 MG PO TABS: 15.0000 mg | ORAL_TABLET | Freq: Every day | ORAL | 1 refills | Status: DC

## 2022-02-04 NOTE — Progress Notes (Signed)
Name: Ariel Adams  MRN/ DOB: [id number], 12-31-1990    Age/ Sex: 31 y.o., female     PCP: Yvonne Kendall, NP   Reason for Endocrinology Evaluation: Hyperthyroidism     Initial Endocrinology Clinic Visit: 01/18/2021      PATIENT IDENTIFIER: [name] is a 31 y.o., female with a past medical history of Graves' disease. She has followed with [facility] since 01/18/2021 for consultative assistance with management of her Graves' disease    HISTORICAL SUMMARY:  She has been diagnosed with hyperthyroidism secondary to Graves' disease in 02/2020. She presented with thyromegaly as well as eye symptoms with bulging  As well as weight loss.    She was started on PTU in 02/2020 but was switched to methimazole by  12/2020 with the understanding that she will not be conceiving   Had ectopic pregnancy prior to her diagnosis   Has strong FH of thyroid disease ( maternal grand mother)    SUBJECTIVE:    Today (02/04/2022):  Ariel Adams is here for her hyperthyroidism secondary to Graves' disease.    Weight stable  She continues with local neck swelling  Denies palpitations  Denies constipation or diarrhea  Denies tremors   She  is taking methimazole 1 tablet every other day instead of 1.5 tabs daily    LMP last month, regular  Uses condoms.       Methimazole 10 mg, 1.5 tabs  daily     HISTORY:  Past Medical History:  Past Medical History:  Diagnosis Date   Hepatitis C    10 years ago   Past Surgical History:  Social History:  reports that she has been smoking cigarettes. She has never used smokeless tobacco. She reports that she does not currently use alcohol. No history on file for drug use. Family History: No family history on file.   HOME MEDICATIONS: Allergies as of 02/04/2022   No Known Allergies      Medication List        Accurate as of February 04, 2022 12:43 PM. If you have any questions, ask your nurse or doctor.           methimazole 10 MG tablet Commonly known as: TAPAZOLE Take 1.5 tablets (15 mg total) by mouth daily.          OBJECTIVE:   PHYSICAL EXAM: VS: BP 112/68 (BP Location: Left Arm, Patient Position: Sitting, Cuff Size: Small)    Pulse 86    Ht 5\' 4"  (1.626 m)    Wt 122 lb 9.6 oz (55.6 kg)    SpO2 98%    BMI 21.04 kg/m    EXAM: General: Pt appears well and is in NAD  Neck: General: Supple without adenopathy. Thyroid: Thyroid size enlarged ~ 60 grams , no thyroid bruit.  Lungs: Clear with good BS bilat with no rales, rhonchi, or wheezes  Heart: Auscultation: RRR.  Abdomen: Normoactive bowel sounds, soft, nontender, without masses or organomegaly palpable  Extremities:  BL LE: No pretibial edema normal ROM and strength.  Mental Status: Judgment, insight: Intact Orientation: Oriented to time, place, and person Mood and affect: No depression, anxiety, or agitation     DATA REVIEWED:  Latest Reference Range & Units 10/01/21 11:57 02/04/22 10:08  TSH 0.35 - 5.50 uIU/mL 0.00 Repeated and verified X2. (L) 0.00 (L)  T4,Free(Direct) 0.60 - 1.60 ng/dL 04/04/22 (H) 9.02 (H)     Results for Ariel Adams, Ariel Adams (MRN [place] [facility]) as of 04/20/2021 07:00  Ref. Range 04/19/2021 09:46  Sodium Latest Ref Range: 135 - 145 mEq/L 137  Potassium Latest Ref Range: 3.5 - 5.1 mEq/L 4.1  Chloride Latest Ref Range: 96 - 112 mEq/L 103  CO2 Latest Ref Range: 19 - 32 mEq/L 27  Glucose Latest Ref Range: 70 - 99 mg/dL 71  BUN Latest Ref Range: 6 - 23 mg/dL 9  Creatinine Latest Ref Range: 0.40 - 1.20 mg/dL 9.79  Calcium Latest Ref Range: 8.4 - 10.5 mg/dL 9.2  Alkaline Phosphatase Latest Ref Range: 39 - 117 U/L 70  Albumin Latest Ref Range: 3.5 - 5.2 g/dL 4.4  AST Latest Ref Range: 0 - 37 U/L 18  ALT Latest Ref Range: 0 - 35 U/L 17  Total Protein Latest Ref Range: 6.0 - 8.3 g/dL 7.4  Total Bilirubin Latest Ref Range: 0.2 - 1.2 mg/dL 0.3  GFR Latest Ref Range: >60.00 mL/min 99.55  WBC Latest Ref Range: 4.0 - 10.5  K/uL 8.3  RBC Latest Ref Range: 3.87 - 5.11 Mil/uL 4.79  Hemoglobin Latest Ref Range: 12.0 - 15.0 g/dL 48.0  HCT Latest Ref Range: 36.0 - 46.0 % 42.5  MCV Latest Ref Range: 78.0 - 100.0 fl 88.7  MCHC Latest Ref Range: 30.0 - 36.0 g/dL 16.5  RDW Latest Ref Range: 11.5 - 15.5 % 14.2  Platelets Latest Ref Range: 150.0 - 400.0 K/uL 288.0  Neutrophils Latest Ref Range: 43.0 - 77.0 % 56.5  Lymphocytes Latest Ref Range: 12.0 - 46.0 % 37.0  Monocytes Relative Latest Ref Range: 3.0 - 12.0 % 4.4  Eosinophil Latest Ref Range: 0.0 - 5.0 % 1.6  Basophil Latest Ref Range: 0.0 - 3.0 % 0.5  NEUT# Latest Ref Range: 1.4 - 7.7 K/uL 4.7  Lymphocyte # Latest Ref Range: 0.7 - 4.0 K/uL 3.1  Monocyte # Latest Ref Range: 0.1 - 1.0 K/uL 0.4  Eosinophils Absolute Latest Ref Range: 0.0 - 0.7 K/uL 0.1  Basophils Absolute Latest Ref Range: 0.0 - 0.1 K/uL 0.0    TRAB <=2.00 IU/L 24.64 High      ASSESSMENT / PLAN / RECOMMENDATIONS:   Hyperthyroidism Secondary to Graves' Disease :  - Pt is clinically euthyroid  - She continues with imperfect adherence at times . She has self reduced her methimazole dose from 1.5 tablet a day to 1 tablet every other day, this is due to weight gain -We again discussed the risk of cardiac arrhythmia which could result in CHF and stroke as well as the risk of increased bone resorption with uncontrolled hyperthyroidism -In the past we discussed  PTU is the recommended medication during first trimester of pregnancy, she was advised to stop methimazole should she test positive and inform me so I can switch to PTU -She is not a candidate for radioactive iodine ablation due to Graves' orbitopathy, we discussed continuing thionamides therapy versus total thyroidectomy.  She has entertained the idea of total thyroidectomy but has not made a definite decision yet - Will proceed with thyroid ultrasound she can schedule it for the next time she is in town   Medications  Methimazole 10 mg, one  and a half  tabs daily    2. Graves' Disease:    -Patient has mild Graves' disease orbitopathy, patient encouraged to establish with ophthalmology   F/U in 4 months     Signed electronically by: Lyndle Herrlich, MD  [facility]  [facility] [address] [place] [place], [place] [address] Phone: [phone] FAX: [phone]  CC: Yvonne Kendall, NP [address] [place] [address] Phone: [phone]  Fax: [phone]   Return to Endocrinology clinic as below: Future Appointments  Date Time Provider Department Center  06/04/2022 10:30 AM Averi Kilty, Konrad Dolores, MD [facility] None

## 2022-03-12 ENCOUNTER — Ambulatory Visit
Admission: RE | Admit: 2022-03-12 | Discharge: 2022-03-12 | Disposition: A | Discharge status: Home / Self Care | Payer: Medicaid Other | Source: Ambulatory Visit | Attending: Internal Medicine | Admitting: Internal Medicine

## 2022-03-12 DIAGNOSIS — E0500 Thyrotoxicosis with diffuse goiter without thyrotoxic crisis or storm: Secondary | ICD-10-CM

## 2022-06-04 ENCOUNTER — Encounter: Payer: Self-pay | Admitting: Internal Medicine

## 2022-06-04 ENCOUNTER — Ambulatory Visit (INDEPENDENT_AMBULATORY_CARE_PROVIDER_SITE_OTHER): Payer: Medicaid Other | Admitting: Internal Medicine

## 2022-06-04 VITALS — BP 104/68 | HR 99 | Ht 64.0 in | Wt 128.8 lb

## 2022-06-04 DIAGNOSIS — E0590 Thyrotoxicosis, unspecified without thyrotoxic crisis or storm: Secondary | ICD-10-CM

## 2022-06-04 DIAGNOSIS — E0500 Thyrotoxicosis with diffuse goiter without thyrotoxic crisis or storm: Secondary | ICD-10-CM | POA: Diagnosis not present

## 2022-06-04 LAB — TSH: TSH: <0.01 u[IU]/mL — ABNORMAL LOW (ref 0.35–5.50)

## 2022-06-04 LAB — T4, FREE: Free T4: 1.02 ng/dL (ref 0.60–1.60)

## 2022-06-04 MED ORDER — METHIMAZOLE 10 MG PO TABS
15.0000 mg | ORAL_TABLET | Freq: Every day | ORAL | 1 refills | Status: DC
Start: 2022-06-04 — End: 2022-10-08

## 2022-06-04 NOTE — Progress Notes (Signed)
Name: Ariel Adams  MRN/ DOB: [id number], [birth year]    Age/ Sex: 31 y.o., female     PCP: Yvonne Kendall, NP   Reason for Endocrinology Evaluation: Hyperthyroidism     Initial Endocrinology Clinic Visit: 01/18/2021      PATIENT IDENTIFIER: Ariel Adams is a 31 y.o., female with a past medical history of Graves' disease. She has followed with [facility] since 01/18/2021 for consultative assistance with management of her Graves' disease    HISTORICAL SUMMARY:  She has been diagnosed with hyperthyroidism secondary to Graves' disease in 02/2020. She presented with thyromegaly as well as eye symptoms with bulging  As well as weight loss.    She was started on PTU in 02/2020 but was switched to methimazole by  12/2020 with the understanding that she will not be conceiving   Had ectopic pregnancy prior to her diagnosis   Has strong FH of thyroid disease ( maternal grand mother)    SUBJECTIVE:    Today (06/04/2022):  Ariel Adams is here for her hyperthyroidism secondary to Graves' disease.    Weight stable  She continues with local neck swelling  Denies palpitations  Denies constipation or diarrhea  Has rare tremors  LMP 2 weeks ago, regular   She is single at this time and not sexually active   Methimazole 10 mg, 1.5 tabs  daily     HISTORY:  Past Medical History:  Past Medical History:  Diagnosis Date   Hepatitis C    10 years ago   Past Surgical History:  Social History:  reports that she has been smoking cigarettes. She has never used smokeless tobacco. She reports that she does not currently use alcohol. No history on file for drug use. Family History: No family history on file.   HOME MEDICATIONS: Allergies as of 06/04/2022   No Known Allergies      Medication List        Accurate as of June 04, 2022 10:54 AM. If you have any questions, ask your nurse or doctor.          methimazole 10 MG tablet Commonly known as: TAPAZOLE Take 1.5  tablets (15 mg total) by mouth daily.          OBJECTIVE:   PHYSICAL EXAM: VS: BP 104/68 (BP Location: Left Arm, Patient Position: Sitting, Cuff Size: Small)   Pulse 99   Ht 5\' 4"  (1.626 m)   Wt 128 lb 12.8 oz (58.4 kg)   SpO2 99%   BMI 22.11 kg/m    EXAM: General: Pt appears well and is in NAD  Neck: General: Supple without adenopathy. Thyroid: Right thyroid asymmetry noted, bilateral  thyroid bruit noted   Lungs: Clear with good BS bilat with no rales, rhonchi, or wheezes  Heart: Auscultation: RRR.  Abdomen: Normoactive bowel sounds, soft, nontender, without masses or organomegaly palpable  Extremities:  BL LE: No pretibial edema normal ROM and strength.  Mental Status: Judgment, insight: Intact Orientation: Oriented to time, place, and person Mood and affect: No depression, anxiety, or agitation     DATA REVIEWED:   Latest Reference Range & Units 06/04/22 11:18  TSH 0.35 - 5.50 uIU/mL <0.01 (L)  T4,Free(Direct) 0.60 - 1.60 ng/dL 08/04/22    TRAB 3.41 IU/L 24.64 High      Thyroid Ultrasound 03/12/2022 Parenchymal Echotexture: Moderately heterogeneous   Isthmus: 0.8 cm   Right lobe: 6.2 x 2.5 x 3.0 cm   Left lobe: 6.8 x 2.5  x 2.3 cm   _________________________________________________________   Estimated total number of nodules >/= 1 cm: 0   Number of spongiform nodules >/=  2 cm not described below (TR1): 0   Number of mixed cystic and solid nodules >/= 1.5 cm not described below (TR2): 0   _________________________________________________________   No discrete nodules are seen within the thyroid gland.   IMPRESSION: Enlarged, diffusely heterogeneous thyroid without discrete nodule.   The above is in keeping with the ACR TI-RADS recommendations - J Am Coll Radiol 2017;14:587-595.     ASSESSMENT / PLAN / RECOMMENDATIONS:   Hyperthyroidism Secondary to Graves' Disease :  - Pt is clinically euthyroid  -She is not a candidate for radioactive  iodine ablation due to Graves' orbitopathy, we discussed continuing thionamides therapy versus total thyroidectomy.  She has entertained the idea of total thyroidectomy but has not made a definite decision yet -She has been more compliant with methimazole intake recently, her free T4 is normal which is the first time in over a year, TSH continues to be low but this lags behind -Ultrasound has shown heterogeneous thyroid but no nodules detected  Medications  Continue methimazole 10 mg, one and a half  tabs daily    2. Graves' Disease:    -Patient has mild Graves' disease orbitopathy, patient encouraged to establish with ophthalmology   F/U in 4 months     Signed electronically by: Lyndle Herrlich, MD  [facility]  [facility] [address] [place] [place], [place] [address] Phone: [phone] FAX: [phone]      CC: Yvonne Kendall, NP [address] [place] [address] Phone: [phone]  Fax: [phone]   Return to Endocrinology clinic as below: No future appointments.

## 2022-06-05 LAB — T3: T3, Total: 259 ng/dL — ABNORMAL HIGH (ref 76–181)

## 2022-10-08 ENCOUNTER — Encounter: Payer: Self-pay | Admitting: Internal Medicine

## 2022-10-08 ENCOUNTER — Ambulatory Visit (INDEPENDENT_AMBULATORY_CARE_PROVIDER_SITE_OTHER): Payer: Medicaid Other | Admitting: Internal Medicine

## 2022-10-08 VITALS — BP 120/80 | HR 95 | Ht 64.0 in | Wt 129.0 lb

## 2022-10-08 DIAGNOSIS — E0590 Thyrotoxicosis, unspecified without thyrotoxic crisis or storm: Secondary | ICD-10-CM

## 2022-10-08 DIAGNOSIS — E0500 Thyrotoxicosis with diffuse goiter without thyrotoxic crisis or storm: Secondary | ICD-10-CM | POA: Diagnosis not present

## 2022-10-08 LAB — TSH: TSH: <0.01 u[IU]/mL — ABNORMAL LOW (ref 0.35–5.50)

## 2022-10-08 LAB — T4, FREE: Free T4: 1.15 ng/dL (ref 0.60–1.60)

## 2022-10-08 MED ORDER — METHIMAZOLE 10 MG PO TABS: 20.0000 mg | ORAL_TABLET | Freq: Every day | ORAL | 2 refills | Status: DC

## 2022-10-08 NOTE — Progress Notes (Signed)
Name: Ariel Adams  MRN/ DOB: [id number], 09/15/1991    Age/ Sex: 31 y.o., female     PCP: Reita May, NP   Reason for Endocrinology Evaluation: Hyperthyroidism     Initial Endocrinology Clinic Visit: 01/18/2021      PATIENT IDENTIFIER: Ms. Aretha Levi is a 31 y.o., female with a past medical history of Graves' disease. She has followed with [facility] since 01/18/2021 for consultative assistance with management of her Graves' disease    HISTORICAL SUMMARY:  She has been diagnosed with hyperthyroidism secondary to Graves' disease in 02/2020. She presented with thyromegaly as well as eye symptoms with bulging  As well as weight loss.    She was started on PTU in 02/2020 but was switched to methimazole by  12/2020 with the understanding that she will not be conceiving   Had ectopic pregnancy prior to her diagnosis   Has strong FH of thyroid disease ( maternal grand mother)    SUBJECTIVE:    Today (10/08/2022):  Ms. Gudgel is here for her hyperthyroidism secondary to Graves' disease.    Weight stable  She continues with local neck swelling  Denies palpitations  Denies constipation or diarrhea  LMP a month ago  Denies burning or  itching of the eyes     Methimazole 10 mg, 1.5 tabs  daily     HISTORY:  Past Medical History:  Past Medical History:  Diagnosis Date   Hepatitis C    10 years ago   Past Surgical History:  Social History:  reports that she has been smoking cigarettes. She has never used smokeless tobacco. She reports that she does not currently use alcohol. No history on file for drug use. Family History: No family history on file.   HOME MEDICATIONS: Allergies as of 10/08/2022   No Known Allergies      Medication List        Accurate as of October 08, 2022  7:12 AM. If you have any questions, ask your nurse or doctor.          methimazole 10 MG tablet Commonly known as: TAPAZOLE Take 1.5 tablets (15 mg total) by mouth  daily.          OBJECTIVE:   PHYSICAL EXAM: VS: There were no vitals taken for this visit.   EXAM: General: Pt appears well and is in NAD  Neck: General: Supple without adenopathy. Thyroid: Right thyroid asymmetry noted, bilateral  thyroid bruit noted   Lungs: Clear with good BS bilat with no rales, rhonchi, or wheezes  Heart: Auscultation: RRR.  Abdomen: Normoactive bowel sounds, soft, nontender, without masses or organomegaly palpable  Extremities:  BL LE: No pretibial edema normal ROM and strength.  Mental Status: Judgment, insight: Intact Orientation: Oriented to time, place, and person Mood and affect: No depression, anxiety, or agitation     DATA REVIEWED:  Latest Reference Range & Units 10/08/22 10:56  TSH 0.35 - 5.50 uIU/mL <0.01 (L)  T4,Free(Direct) 0.60 - 1.60 ng/dL 1.15     TRAB <=2.00 IU/L 24.64 High      Thyroid Ultrasound 03/12/2022 Parenchymal Echotexture: Moderately heterogeneous   Isthmus: 0.8 cm   Right lobe: 6.2 x 2.5 x 3.0 cm   Left lobe: 6.8 x 2.5 x 2.3 cm   _________________________________________________________   Estimated total number of nodules >/= 1 cm: 0   Number of spongiform nodules >/=  2 cm not described below (TR1): 0   Number of mixed cystic and  solid nodules >/= 1.5 cm not described below (TR2): 0   _________________________________________________________   No discrete nodules are seen within the thyroid gland.   IMPRESSION: Enlarged, diffusely heterogeneous thyroid without discrete nodule.   The above is in keeping with the ACR TI-RADS recommendations - J Am Coll Radiol 2017;14:587-595.     ASSESSMENT / PLAN / RECOMMENDATIONS:   Hyperthyroidism Secondary to Graves' Disease :  - Pt is clinically euthyroid  -We have again discussed the need to consider alternative long term treatment such as RAI ablation vs total thyroidectomy, I prefer total thyroidectomy for her but she declines alternative therapy at this  time   Medications  Increase  methimazole 10 mg, 2  tabs daily      2. Graves' Disease:    -Patient has mild Graves' disease orbitopathy, patient encouraged to establish with ophthalmology   F/U in 6 months     Signed electronically by: Lyndle Herrlich, MD  [facility]  [facility] [address] [place] [place], [place] [address] Phone: [phone] FAX: [phone]      CC: Yvonne Kendall, NP [address] [place] [address] Phone: [phone]  Fax: [phone]   Return to Endocrinology clinic as below: Future Appointments  Date Time Provider Department Center  10/08/2022 10:10 AM Hurshell Dino, Konrad Dolores, MD [facility] None

## 2022-10-09 ENCOUNTER — Encounter: Payer: Self-pay | Admitting: Internal Medicine

## 2022-10-09 LAB — T3: T3, Total: 297 ng/dL — ABNORMAL HIGH (ref 76–181)

## 2023-04-16 ENCOUNTER — Encounter: Payer: Self-pay | Admitting: Internal Medicine

## 2023-04-16 ENCOUNTER — Ambulatory Visit (INDEPENDENT_AMBULATORY_CARE_PROVIDER_SITE_OTHER): Payer: Medicaid Other | Admitting: Internal Medicine

## 2023-04-16 VITALS — BP 122/76 | HR 80 | Ht 64.0 in | Wt 131.0 lb

## 2023-04-16 DIAGNOSIS — E0500 Thyrotoxicosis with diffuse goiter without thyrotoxic crisis or storm: Secondary | ICD-10-CM

## 2023-04-16 DIAGNOSIS — E0590 Thyrotoxicosis, unspecified without thyrotoxic crisis or storm: Secondary | ICD-10-CM | POA: Diagnosis not present

## 2023-04-16 LAB — T4, FREE: Free T4: 2.58 ng/dL — ABNORMAL HIGH (ref 0.60–1.60)

## 2023-04-16 LAB — TSH: TSH: 0 u[IU]/mL — ABNORMAL LOW (ref 0.35–5.50)

## 2023-04-16 MED ORDER — METHIMAZOLE 10 MG PO TABS: 20.0000 mg | ORAL_TABLET | Freq: Every day | ORAL | 2 refills | Status: DC

## 2023-04-16 NOTE — Progress Notes (Signed)
Name: Ariel Adams  MRN/ DOB: [id number], 09/16/1991    Age/ Sex: 32 y.o., female     PCP: Yvonne Kendall, NP   Reason for Endocrinology Evaluation: Hyperthyroidism     Initial Endocrinology Clinic Visit: 01/18/2021      PATIENT IDENTIFIER: Ariel Adams is a 32 y.o., female with a past medical history of Graves' disease. She has followed with [facility] since 01/18/2021 for consultative assistance with management of her Graves' disease    HISTORICAL SUMMARY:  She has been diagnosed with hyperthyroidism secondary to Graves' disease in 02/2020. She presented with thyromegaly as well as eye symptoms with bulging  As well as weight loss.    She was started on PTU in 02/2020 but was switched to methimazole by  12/2020 with the understanding that she will not be conceiving   Had ectopic pregnancy prior to her diagnosis   Has strong FH of thyroid disease ( maternal grand mother)    SUBJECTIVE:    Today (04/16/2023):  Ariel Adams is here for her hyperthyroidism secondary to Graves' disease.     She continues with local neck swelling associated with dysphagia  Denies palpitations  Denies constipation or diarrhea  LMP last month, regular  Denies burning or  itching of the eyes   Methimazole 10 mg, 2 tabs  daily - continues with taking 1.5 tabs daily     HISTORY:  Past Medical History:  Past Medical History:  Diagnosis Date   Hepatitis C    10 years ago   Past Surgical History:  Social History:  reports that she has been smoking cigarettes. She has never used smokeless tobacco. She reports that she does not currently use alcohol. No history on file for drug use. Family History: No family history on file.   HOME MEDICATIONS: Allergies as of 04/16/2023   No Known Allergies      Medication List        Accurate as of April 16, 2023 10:26 AM. If you have any questions, ask your nurse or doctor.          methimazole 10 MG tablet Commonly known as:  TAPAZOLE Take 2 tablets (20 mg total) by mouth daily. What changed: additional instructions          OBJECTIVE:   PHYSICAL EXAM: VS: BP 122/76 (BP Location: Left Arm, Patient Position: Sitting, Cuff Size: Small)   Pulse 80   Ht  (1.626 m)   Wt 131 lb (59.4 kg)   SpO2 94%   BMI 22.49 kg/m    EXAM: General: Pt appears well and is in NAD  Neck: General: Supple without adenopathy. Thyroid: Right thyroid asymmetry noted, bilateral  thyroid bruit noted   Lungs: Clear with good BS bilat with no rales, rhonchi, or wheezes  Heart: Auscultation: RRR.  Abdomen: Normoactive bowel sounds, soft, nontender, without masses or organomegaly palpable  Extremities:  BL LE: No pretibial edema normal ROM and strength.  Mental Status: Judgment, insight: Intact Orientation: Oriented to time, place, and person Mood and affect: No depression, anxiety, or agitation     DATA REVIEWED:  Latest Reference Range & Units 10/08/22 10:56  TSH 0.35 - 5.50 uIU/mL <0.01 (L)  T4,Free(Direct) 0.60 - 1.60 ng/dL 4.09     TRAB <=8.11 IU/L 24.64 High      Thyroid Ultrasound 03/12/2022 Parenchymal Echotexture: Moderately heterogeneous   Isthmus: 0.8 cm   Right lobe: 6.2 x 2.5 x 3.0 cm   Left lobe: 6.8  x 2.5 x 2.3 cm   _________________________________________________________   Estimated total number of nodules >/= 1 cm: 0   Number of spongiform nodules >/=  2 cm not described below (TR1): 0   Number of mixed cystic and solid nodules >/= 1.5 cm not described below (TR2): 0   _________________________________________________________   No discrete nodules are seen within the thyroid gland.   IMPRESSION: Enlarged, diffusely heterogeneous thyroid without discrete nodule.   The above is in keeping with the ACR TI-RADS recommendations - J Am Coll Radiol 2017;14:587-595.     ASSESSMENT / PLAN / RECOMMENDATIONS:   Hyperthyroidism Secondary to Graves' Disease :  - Pt is clinically  euthyroid but unfortunately she continues to take less methimazole than previously prescribed  - She is interetsed in surgical intervention, which I agree with  -A referral has been placed to general surgery -TFTs continue to show hyperthyroid, will increase methimazole -Patient to notify us of the surgery date, as I will need to see her at the 6-week mark after surgery  Medications  Increase methimazole 10 mg, 2  tabs daily      2. Graves' Disease:    -No evidence of extrathyroidal manifestation of Graves' disease   F/U in 6 months     Signed electronically by: Lyndle Herrlich, MD  [facility] Endocrinology  [facility] [address] [place] [place], [place] [address] Phone: [phone] FAX: [phone]      CC: Yvonne Kendall, NP [address] [place] [address] Phone: [phone]  Fax: [phone]   Return to Endocrinology clinic as below: No future appointments.

## 2023-04-16 NOTE — Patient Instructions (Signed)
Please let us know as soon as your know the date of your surgery, so we can plan a follow up with me

## 2023-04-17 LAB — T3: T3, Total: 270 ng/dL — ABNORMAL HIGH (ref 76–181)

## 2023-06-07 IMAGING — US US THYROID
1 series · 14 of 25 positions shown · non-contrast
Comparison: None.

CLINICAL DATA: Grade disease

Hyperthyroidism
EXAM:
THYROID ULTRASOUND
TECHNIQUE: Ultrasound examination of the thyroid gland and adjacent soft
tissues was performed.

[Series 1: us thyroid · 0.07mm/px · 14 of 42 slices shown]
[im 1/42]
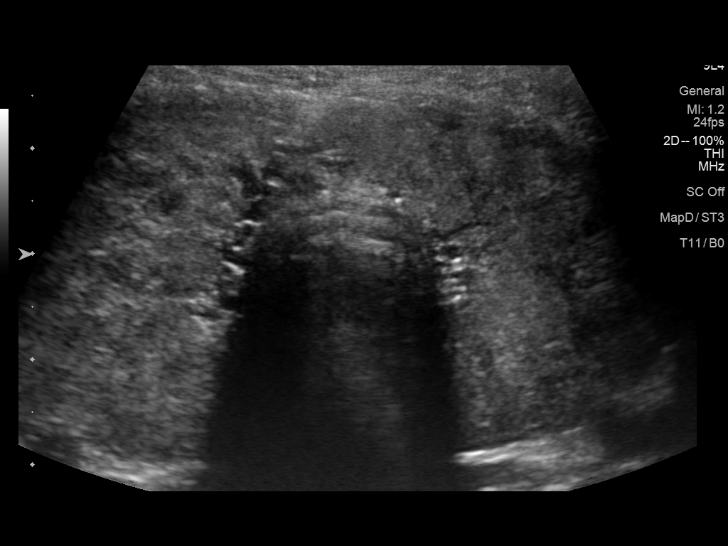
[im 4/42]
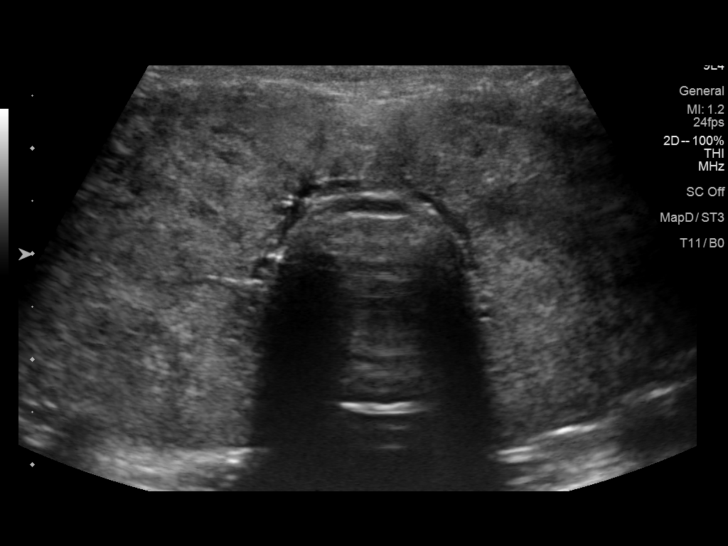
[im 7/42]
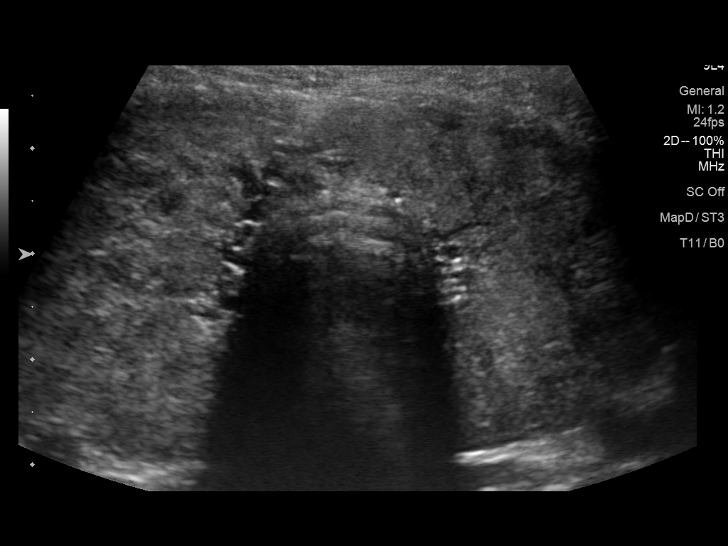
[im 11/42]
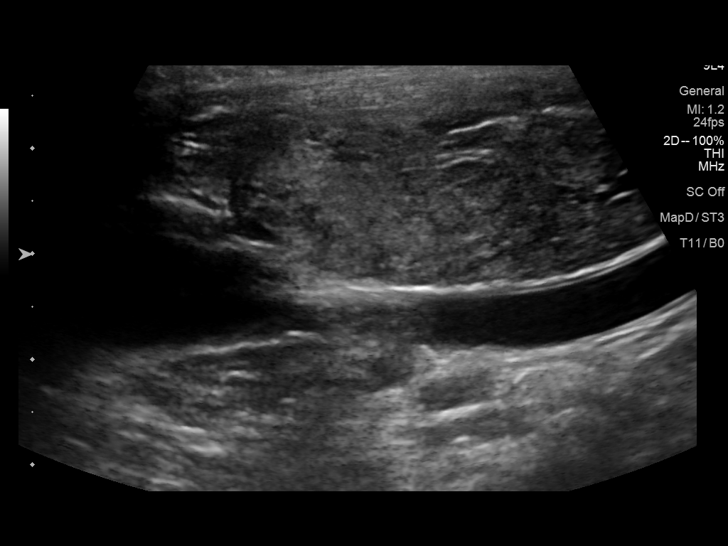
[im 14/42]
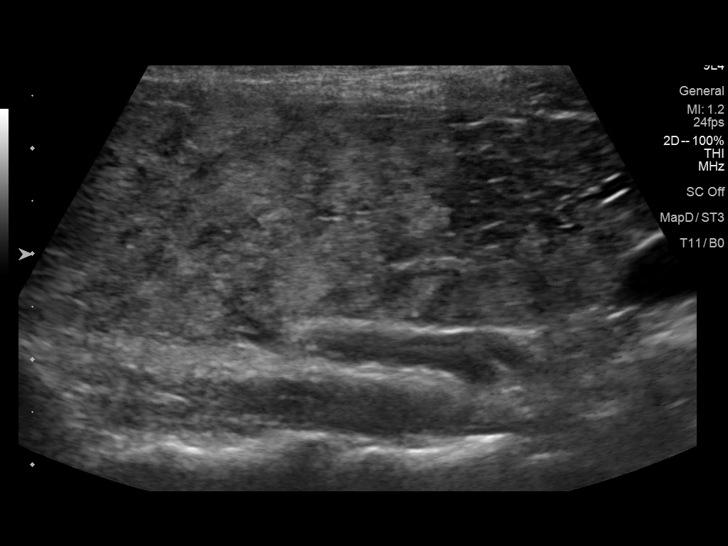
[im 16/42]
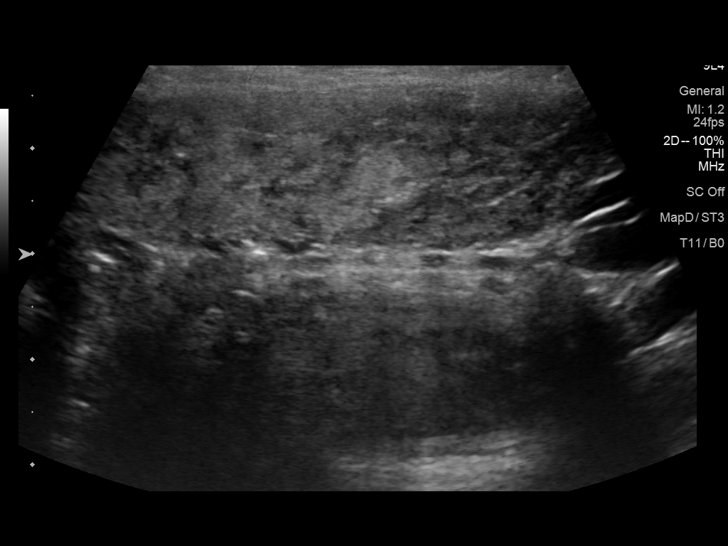
[im 19/42]
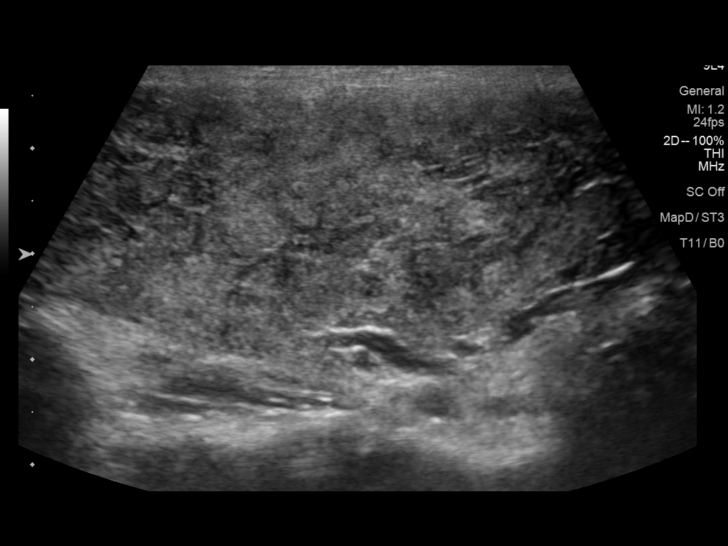
[im 23/42]
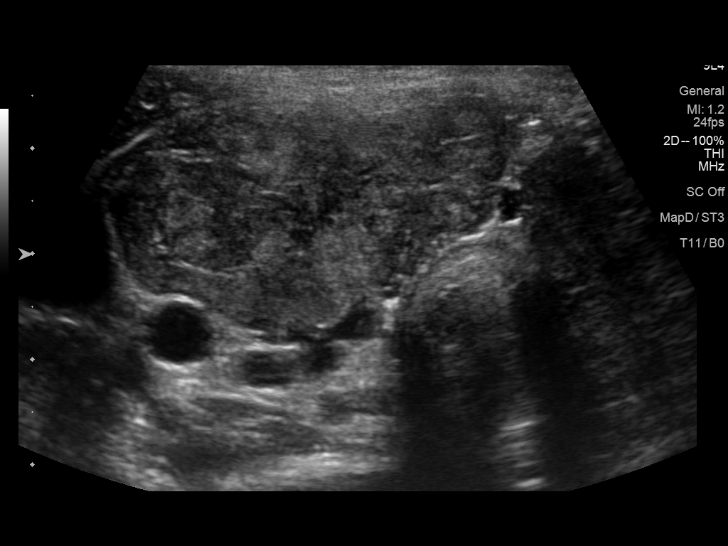
[im 26/42]
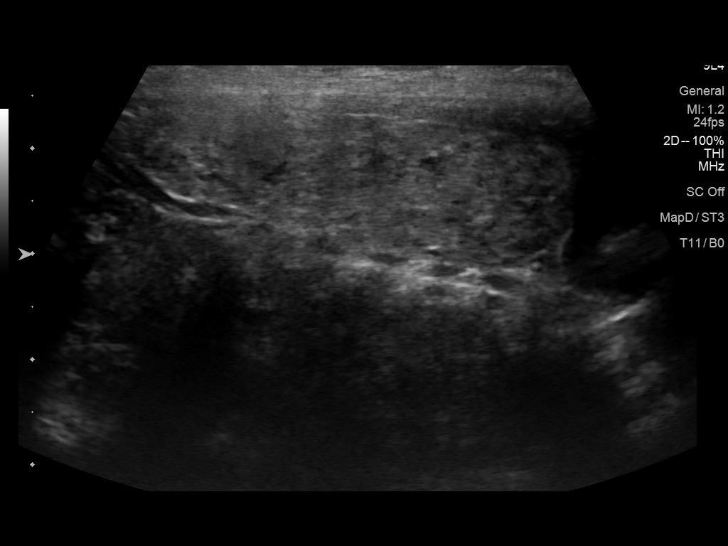
[im 28/42]
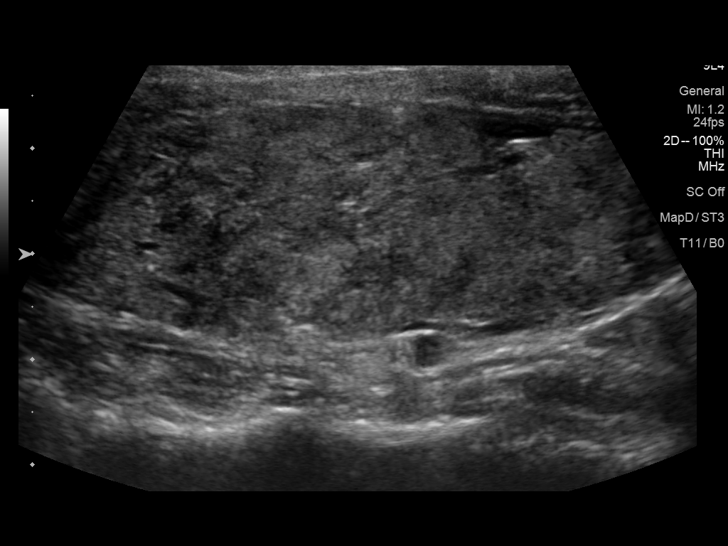
[im 31/42]
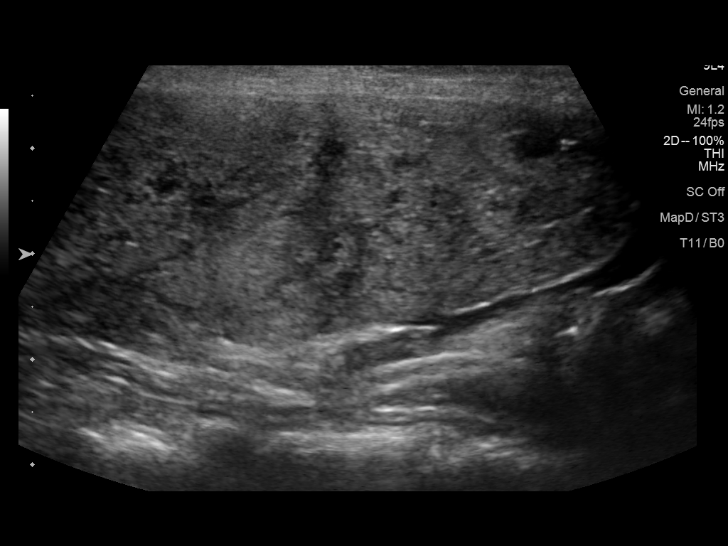
[im 35/42]
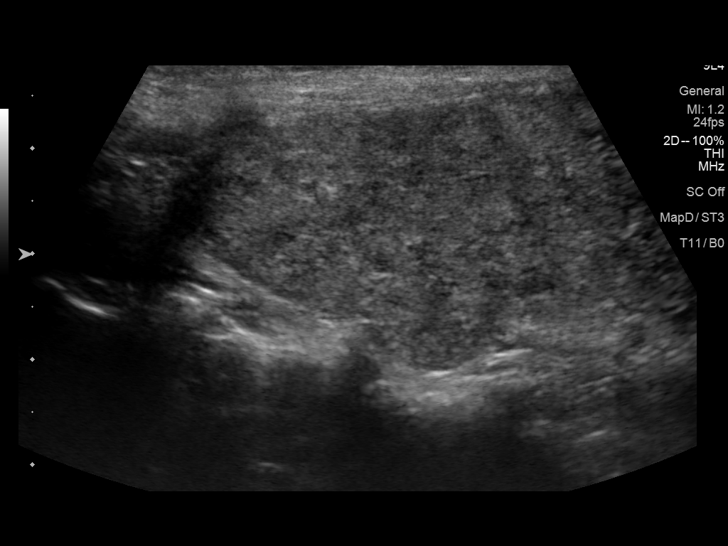
[im 38/42]
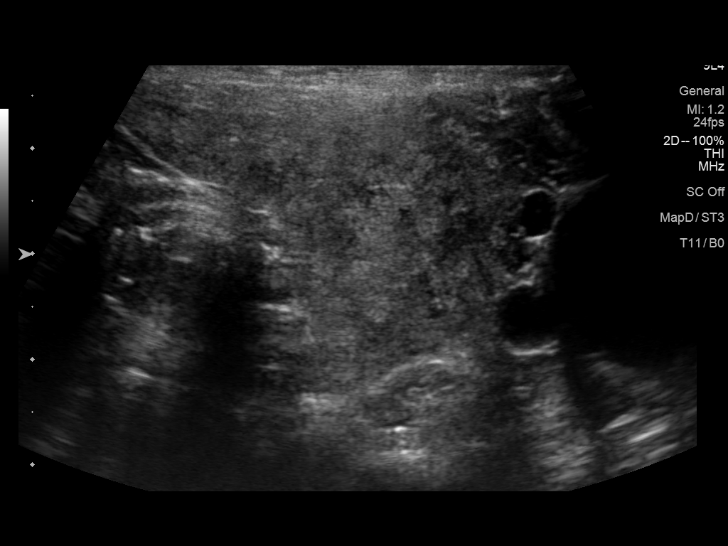
[im 42/42]
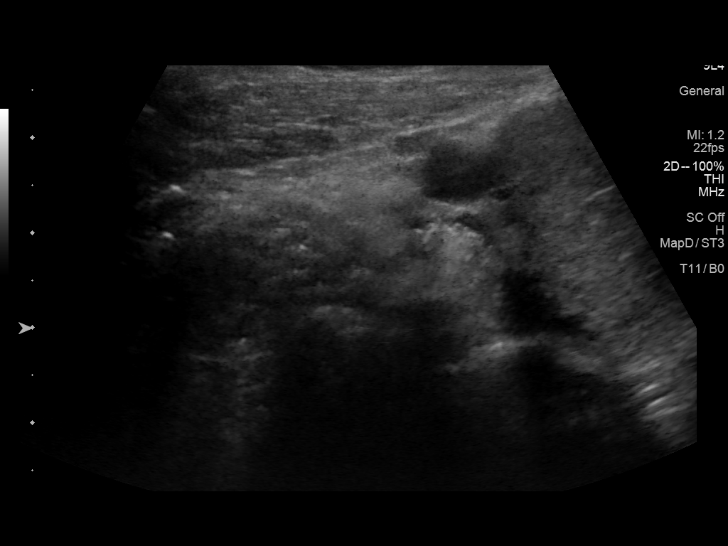

[14 of 25 positions shown; findings below may reference images not displayed]

FINDINGS: Parenchymal Echotexture: Moderately heterogeneous

Isthmus: 0.8 cm

Right lobe: 6.2 x 2.5 x 3.0 cm

Left lobe: 6.8 x 2.5 x 2.3 cm

_________________________________________________________

Estimated total number of nodules >/= 1 cm: 0

Number of spongiform nodules >/=  2 cm not described below (TR1): 0

Number of mixed cystic and solid nodules >/= 1.5 cm not described
below (TR2): 0

_________________________________________________________

No discrete nodules are seen within the thyroid gland.
IMPRESSION: Enlarged, diffusely heterogeneous thyroid without discrete nodule.

The above is in keeping with the ACR TI-RADS recommendations - [HOSPITAL] 6812;[DATE].

## 2023-06-11 ENCOUNTER — Encounter: Payer: Self-pay | Admitting: Internal Medicine

## 2023-09-04 ENCOUNTER — Encounter: Payer: Self-pay | Admitting: Internal Medicine

## 2023-10-16 ENCOUNTER — Ambulatory Visit: Payer: Medicaid Other | Admitting: Internal Medicine

## 2023-10-16 ENCOUNTER — Encounter: Payer: Self-pay | Admitting: Internal Medicine

## 2023-10-16 VITALS — BP 110/68 | HR 65 | Ht 64.0 in | Wt 132.0 lb

## 2023-10-16 DIAGNOSIS — E0590 Thyrotoxicosis, unspecified without thyrotoxic crisis or storm: Secondary | ICD-10-CM | POA: Diagnosis not present

## 2023-10-16 DIAGNOSIS — E0500 Thyrotoxicosis with diffuse goiter without thyrotoxic crisis or storm: Secondary | ICD-10-CM | POA: Diagnosis not present

## 2023-10-16 LAB — T4, FREE: Free T4: 0.6 ng/dL (ref 0.60–1.60)

## 2023-10-16 LAB — TSH: TSH: 3.24 u[IU]/mL (ref 0.35–5.50)

## 2023-10-16 LAB — T3, FREE: T3, Free: 2.9 pg/mL (ref 2.3–4.2)

## 2023-10-16 NOTE — Progress Notes (Signed)
Name: [name]  MRN/ DOB: [id number], 05/31/1991    Age/ Sex: 32 y.o., female     PCP: Yvonne Kendall, NP   Reason for Endocrinology Evaluation: Hyperthyroidism     Initial Endocrinology Clinic Visit: 01/18/2021      PATIENT IDENTIFIER: Ariel Adams is a 32 y.o., female with a past medical history of Graves' disease. She has followed with [facility] since 01/18/2021 for consultative assistance with management of her Graves' disease    HISTORICAL SUMMARY:  She has been diagnosed with hyperthyroidism secondary to Graves' disease in 02/2020. She presented with thyromegaly as well as eye symptoms with bulging  As well as weight loss.    She was started on PTU in 02/2020 but was switched to methimazole by  12/2020 with the understanding that she will not be conceiving   Had ectopic pregnancy prior to her diagnosis   Has strong FH of thyroid disease ( maternal grand mother)    SUBJECTIVE:    Today (10/16/2023):  [name] is here for her hyperthyroidism secondary to Graves' disease.    She was referred to general surgery in [place], She did have a consultation but declined to schedule the surgery.   She assures me compliance with methimazole, and has been using a pillbox daily. Wright stable  She has noted decrease in neck size  Denies palpitations  Denies constipation or diarrhea  Has occasional tremors  LMP 2 months , regular  Denies burning or  itching of the eyes   Methimazole 10 mg, 2 tabs  daily     HISTORY:  Past Medical History:  Past Medical History:  Diagnosis Date   Hepatitis C    10 years ago   Past Surgical History:  Social History:  reports that she has been smoking cigarettes. She has never used smokeless tobacco. She reports that she does not currently use alcohol. No history on file for drug use. Family History: No family history on file.   HOME MEDICATIONS: Allergies as of 10/16/2023   No Known Allergies       Medication List        Accurate as of October 16, 2023 10:07 AM. If you have any questions, ask your nurse or doctor.          methimazole 10 MG tablet Commonly known as: TAPAZOLE Take 2 tablets (20 mg total) by mouth daily.          OBJECTIVE:   PHYSICAL EXAM: VS: BP 110/68 (BP Location: Left Arm, Patient Position: Sitting, Cuff Size: Small)   Pulse 65   Ht 5\' 4"  (1.626 m)   Wt 132 lb (59.9 kg)   SpO2 95%   BMI 22.66 kg/m    EXAM: General: Pt appears well and is in NAD  Neck: General: Supple without adenopathy. Thyroid: Right thyroid asymmetry noted,No  thyroid bruits noted   Lungs: Clear with good BS bilat   Heart: Auscultation: RRR.  Abdomen: Soft, non tender   Extremities:  BL LE: No pretibial edema   Mental Status: Judgment, insight: Intact Orientation: Oriented to time, place, and person Mood and affect: No depression, anxiety, or agitation     DATA REVIEWED:  Latest Reference Range & Units 10/16/23 10:33  TSH 0.35 - 5.50 uIU/mL 3.24  Triiodothyronine,Free,Serum 2.3 - 4.2 pg/mL 2.9  T4,Free(Direct) 0.60 - 1.60 ng/dL 7.82    TRAB <=9.56 IU/L 24.64 High      Thyroid Ultrasound 03/12/2022 Parenchymal Echotexture: Moderately heterogeneous  Isthmus: 0.8 cm   Right lobe: 6.2 x 2.5 x 3.0 cm   Left lobe: 6.8 x 2.5 x 2.3 cm   _________________________________________________________   Estimated total number of nodules >/= 1 cm: 0   Number of spongiform nodules >/=  2 cm not described below (TR1): 0   Number of mixed cystic and solid nodules >/= 1.5 cm not described below (TR2): 0   _________________________________________________________   No discrete nodules are seen within the thyroid gland.   IMPRESSION: Enlarged, diffusely heterogeneous thyroid without discrete nodule.   The above is in keeping with the ACR TI-RADS recommendations - J Am Coll Radiol 2017;14:587-595.     ASSESSMENT / PLAN / RECOMMENDATIONS:   Hyperthyroidism  Secondary to Graves' Disease :  - Pt is clinically euthyroid  -Repeated TFTs have finally normalized, she has been more compliant with using a pillbox daily -She did meet with the surgeon and have a consultation, but declined to schedule surgery -Not a candidate for radioactive iodine ablation due to risk of  Graves' orbitopathy -Will decrease methimazole as below    Medications  Decrease methimazole 10 mg, 1.5  tabs daily      2. Graves' Disease:    -No evidence of extrathyroidal manifestation of Graves' disease   F/U in 6 months     Signed electronically by: Lyndle Herrlich, MD  [facility] Endocrinology  [facility] [address] [place] [place], [place] [address] Phone: [phone] FAX: [phone]      CC: Yvonne Kendall, NP [address] [place] [address] Phone: [phone]  Fax: [phone]   Return to Endocrinology clinic as below: Future Appointments  Date Time Provider Department Center  10/16/2023 10:10 AM Rupa Lagan, Konrad Dolores, MD [facility] None

## 2023-10-17 MED ORDER — METHIMAZOLE 10 MG PO TABS: 15.0000 mg | ORAL_TABLET | Freq: Every day | ORAL | 2 refills | Status: DC

## 2024-02-18 ENCOUNTER — Encounter: Payer: Self-pay | Admitting: Internal Medicine

## 2024-02-18 DIAGNOSIS — E0590 Thyrotoxicosis, unspecified without thyrotoxic crisis or storm: Secondary | ICD-10-CM

## 2024-03-13 ENCOUNTER — Telehealth: Admitting: Nurse Practitioner

## 2024-04-07 ENCOUNTER — Encounter: Payer: Self-pay | Admitting: Neurology

## 2024-04-20 ENCOUNTER — Ambulatory Visit (INDEPENDENT_AMBULATORY_CARE_PROVIDER_SITE_OTHER): Payer: Medicaid Other | Admitting: Internal Medicine

## 2024-04-20 ENCOUNTER — Encounter: Payer: Self-pay | Admitting: Internal Medicine

## 2024-04-20 VITALS — BP 104/66 | HR 78 | Ht 64.0 in | Wt 120.0 lb

## 2024-04-20 DIAGNOSIS — E0500 Thyrotoxicosis with diffuse goiter without thyrotoxic crisis or storm: Secondary | ICD-10-CM

## 2024-04-20 DIAGNOSIS — R09A2 Foreign body sensation, throat: Secondary | ICD-10-CM

## 2024-04-20 DIAGNOSIS — E0590 Thyrotoxicosis, unspecified without thyrotoxic crisis or storm: Secondary | ICD-10-CM

## 2024-04-20 NOTE — Progress Notes (Unsigned)
 Name: Ariel Adams  MRN/ DOB: [id number], 1991-10-26    Age/ Sex: 33 y.o., female     PCP: Charma Coon, NP   Reason for Endocrinology Evaluation: Hyperthyroidism     Initial Endocrinology Clinic Visit: 01/18/2021      PATIENT IDENTIFIER: [name] is a 33 y.o., female with a past medical history of Graves' disease. She has followed with [facility] since 01/18/2021 for consultative assistance with management of her Graves' disease    HISTORICAL SUMMARY:  She has been diagnosed with hyperthyroidism secondary to Graves' disease in 02/2020. She presented with thyromegaly as well as eye symptoms with bulging  As well as weight loss.    She was started on PTU in 02/2020 but was switched to methimazole  by  12/2020 with the understanding that she will not be conceiving   Had ectopic pregnancy prior to her diagnosis   Has strong FH of thyroid  disease ( maternal grand mother)    SUBJECTIVE:    Today (04/20/2024):  [name] is here for her hyperthyroidism secondary to Graves' disease.     Patient has been noted with weight loss Has noted continued local neck swelling  She has stomach issues with globus sensation , no heartburn issues  Has nausea and constipation with abdominal pain , has a pending appointment with ObGyn for left ovarian cysts Has been using OTC laxatives  LMP last month -regular  Denies palpitations  Denies  tremors  Denies burning or  itching of the eyes   Methimazole  10 mg, 1.5 tabs  daily     HISTORY:  Past Medical History:  Past Medical History:  Diagnosis Date   Hepatitis C    10 years ago   Past Surgical History:  Social History:  reports that she has been smoking cigarettes. She has never used smokeless tobacco. She reports that she does not currently use alcohol. No history on file for drug use. Family History: No family history on file.   HOME MEDICATIONS: Allergies as of 04/20/2024   No Known Allergies       Medication List        Accurate as of April 20, 2024 10:22 AM. If you have any questions, ask your nurse or doctor.          methimazole  10 MG tablet Commonly known as: TAPAZOLE  Take 1.5 tablets (15 mg total) by mouth daily.          OBJECTIVE:   PHYSICAL EXAM: VS: BP 104/66 (BP Location: Left Arm, Patient Position: Sitting, Cuff Size: Small)   Pulse 78   Ht 5\' 4"  (1.626 m)   Wt 120 lb (54.4 kg)   SpO2 97%   BMI 20.60 kg/m    EXAM: General: Pt appears well and is in NAD  Neck: General: Supple without adenopathy. Thyroid : Right thyroid  asymmetry noted,No  thyroid  bruits noted   Lungs: Clear with good BS bilat   Heart: Auscultation: RRR.  Abdomen: Soft, non tender   Extremities:  BL LE: No pretibial edema   Mental Status: Judgment, insight: Intact Orientation: Oriented to time, place, and person Mood and affect: No depression, anxiety, or agitation     DATA REVIEWED:   Latest Reference Range & Units 04/20/24 10:45  AG Ratio 1.0 - 2.5 (calc) 1.8  AST 10 - 30 U/L 15  ALT 6 - 29 U/L 11  Total Protein 6.1 - 8.1 g/dL 6.8  Bilirubin, Direct 0.0 - 0.2 mg/dL 0.2  Indirect Bilirubin 0.2 -  1.2 mg/dL (calc) 0.5  Total Bilirubin 0.2 - 1.2 mg/dL 0.7  Alkaline phosphatase (APISO) 31 - 125 U/L 41  Globulin 1.9 - 3.7 g/dL (calc) 2.4  WBC 3.8 - 40.9 Thousand/uL 7.4  RBC 3.80 - 5.10 Million/uL 3.95  Hemoglobin 11.7 - 15.5 g/dL 81.1  HCT 91.4 - 78.2 % 37.4  MCV 80.0 - 100.0 fL 94.7  MCH 27.0 - 33.0 pg 31.6  MCHC 32.0 - 36.0 g/dL 95.6  RDW 21.3 - 08.6 % 12.8  Platelets 140 - 400 Thousand/uL 312  MPV 7.5 - 12.5 fL 10.0  Neutrophils % 62.3  Monocytes Relative % 5.5  Eosinophil % 0.8  Basophil % 0.7  NEUT# 1,500 - 7,800 cells/uL 4,610  Total Lymphocyte % 30.7  Eosinophils Absolute 15 - 500 cells/uL 59  Basophils Absolute 0 - 200 cells/uL 52  Absolute Monocytes 200 - 950 cells/uL 407  Preg, Serum  NEGATIVE  TSH mIU/L 3.47  T4,Free(Direct) 0.8 - 1.8 ng/dL 1.1   Albumin MSPROF 3.6 - 5.1 g/dL 4.4  Absolute Lymphocytes 850 - 3,900 cells/uL 2,272      TRAB <=2.00 IU/L 24.64 High      Thyroid  Ultrasound 03/12/2022 Parenchymal Echotexture: Moderately heterogeneous   Isthmus: 0.8 cm   Right lobe: 6.2 x 2.5 x 3.0 cm   Left lobe: 6.8 x 2.5 x 2.3 cm   _________________________________________________________   Estimated total number of nodules >/= 1 cm: 0   Number of spongiform nodules >/=  2 cm not described below (TR1): 0   Number of mixed cystic and solid nodules >/= 1.5 cm not described below (TR2): 0   _________________________________________________________   No discrete nodules are seen within the thyroid  gland.   IMPRESSION: Enlarged, diffusely heterogeneous thyroid  without discrete nodule.      ASSESSMENT / PLAN / RECOMMENDATIONS:   Hyperthyroidism Secondary to Graves' Disease :  - Pt is clinically euthyroid  -She did meet with the surgeon and have a consultation, but declined to schedule surgery -Not a candidate for radioactive iodine ablation due to risk of  Graves' orbitopathy - TFTs remain normal, will decrease methimazole  - CBC normal    Medications  Decrease methimazole  10 mg, 1 tab daily      2. Graves' Disease:    -No evidence of extrathyroidal manifestation of Graves' disease    3. Globus sensation /Nausea/Constipation    -High suspicion for GERD causing globus sensation -Encouraged hydration and increase fiber intake -Will defer to PCP - Hepatic function normal      F/U in 6 months     Signed electronically by: Natale Bail, MD  [facility] Endocrinology  [facility] [address] [place] [place], [place] [address] Phone: [phone] FAX: [phone]      CC: Charma Coon, NP [address] [place] [address] Phone: [phone]  Fax: [phone]   Return to Endocrinology clinic as below: Future Appointments  Date Time Provider  Department Center  08/19/2024  7:50 AM Merriam Abbey, DO [facility] None

## 2024-04-21 ENCOUNTER — Encounter: Payer: Self-pay | Admitting: Internal Medicine

## 2024-04-21 DIAGNOSIS — R09A2 Foreign body sensation, throat: Secondary | ICD-10-CM | POA: Insufficient documentation

## 2024-04-21 LAB — HEPATIC FUNCTION PANEL
AG Ratio: 1.8 (calc) (ref 1.0–2.5)
ALT: 11 U/L (ref 6–29)
AST: 15 U/L (ref 10–30)
Albumin: 4.4 g/dL (ref 3.6–5.1)
Alkaline phosphatase (APISO): 41 U/L (ref 31–125)
Bilirubin, Direct: 0.2 mg/dL (ref 0.0–0.2)
Globulin: 2.4 g/dL (ref 1.9–3.7)
Indirect Bilirubin: 0.5 mg/dL (ref 0.2–1.2)
Total Bilirubin: 0.7 mg/dL (ref 0.2–1.2)
Total Protein: 6.8 g/dL (ref 6.1–8.1)

## 2024-04-21 LAB — CBC WITH DIFFERENTIAL/PLATELET
Absolute Lymphocytes: 2272 {cells}/uL (ref 850–3900)
Absolute Monocytes: 407 {cells}/uL (ref 200–950)
Basophils Absolute: 52 {cells}/uL (ref 0–200)
Basophils Relative: 0.7 %
Eosinophils Absolute: 59 {cells}/uL (ref 15–500)
Eosinophils Relative: 0.8 %
HCT: 37.4 % (ref 35.0–45.0)
Hemoglobin: 12.5 g/dL (ref 11.7–15.5)
MCH: 31.6 pg (ref 27.0–33.0)
MCHC: 33.4 g/dL (ref 32.0–36.0)
MCV: 94.7 fL (ref 80.0–100.0)
MPV: 10 fL (ref 7.5–12.5)
Monocytes Relative: 5.5 %
Neutro Abs: 4610 {cells}/uL (ref 1500–7800)
Neutrophils Relative %: 62.3 %
Platelets: 312 10*3/uL (ref 140–400)
RBC: 3.95 10*6/uL (ref 3.80–5.10)
RDW: 12.8 % (ref 11.0–15.0)
Total Lymphocyte: 30.7 %
WBC: 7.4 10*3/uL (ref 3.8–10.8)

## 2024-04-21 LAB — T4, FREE: Free T4: 1.1 ng/dL (ref 0.8–1.8)

## 2024-04-21 LAB — HCG, SERUM, QUALITATIVE: Preg, Serum: NEGATIVE

## 2024-04-21 LAB — TSH: TSH: 3.47 m[IU]/L

## 2024-04-21 MED ORDER — METHIMAZOLE 10 MG PO TABS: 10.0000 mg | ORAL_TABLET | Freq: Every day | ORAL | 2 refills | Status: DC

## 2024-08-19 ENCOUNTER — Ambulatory Visit: Admitting: Neurology

## 2024-08-23 ENCOUNTER — Encounter: Payer: Self-pay | Admitting: Internal Medicine

## 2024-08-23 DIAGNOSIS — E0500 Thyrotoxicosis with diffuse goiter without thyrotoxic crisis or storm: Secondary | ICD-10-CM

## 2024-08-24 NOTE — Addendum Note (Signed)
 Addended by: SAM DONELL PARAS on: 08/24/2024 07:42 AM   Modules accepted: Orders

## 2024-10-25 ENCOUNTER — Ambulatory Visit: Admitting: Internal Medicine

## 2024-11-03 ENCOUNTER — Ambulatory Visit (INDEPENDENT_AMBULATORY_CARE_PROVIDER_SITE_OTHER): Admitting: Internal Medicine

## 2024-11-03 ENCOUNTER — Encounter: Payer: Self-pay | Admitting: Internal Medicine

## 2024-11-03 ENCOUNTER — Other Ambulatory Visit

## 2024-11-03 VITALS — BP 115/78 | HR 101 | Ht 64.0 in | Wt 129.0 lb

## 2024-11-03 DIAGNOSIS — E0590 Thyrotoxicosis, unspecified without thyrotoxic crisis or storm: Secondary | ICD-10-CM | POA: Diagnosis not present

## 2024-11-03 DIAGNOSIS — E0500 Thyrotoxicosis with diffuse goiter without thyrotoxic crisis or storm: Secondary | ICD-10-CM

## 2024-11-03 NOTE — Progress Notes (Unsigned)
 Name: Ariel Adams  MRN/ DOB: [id number], 10/28/1991    Age/ Sex: 33 y.o., female     PCP: Lonna Leonor HERO, NP   Reason for Endocrinology Evaluation: Hyperthyroidism     Initial Endocrinology Clinic Visit: 01/18/2021      PATIENT IDENTIFIER: [name] is a 33 y.o., female with a past medical history of Graves' disease. She has followed with [facility] since 01/18/2021 for consultative assistance with management of her Graves' disease    HISTORICAL SUMMARY:  She has been diagnosed with hyperthyroidism secondary to Graves' disease in 02/2020. She presented with thyromegaly as well as eye symptoms with bulging  As well as weight loss.    She was started on PTU in 02/2020 but was switched to methimazole  by  12/2020 with the understanding that she will not be conceiving   Had ectopic pregnancy prior to her diagnosis   Has strong FH of thyroid  disease ( maternal grand mother)    SUBJECTIVE:    Today (11/03/2024):  Ariel Adams is here for her hyperthyroidism secondary to Graves' disease.    Since her last visit here, she contacted our office with eye symptoms, a referral was placed to ophthalmology in September, 2025. She has been examined and has was noted with minimal proptosis and dry eyes   Patient has been noted weight gain No local neck swelling  She was diagnosed with  endometriosis and left ovarian cyst, she hasn't seen the Gyn yet.  No constipation or diarrhea  No palpitations  LMP 3 weeks ago - monthly   Methimazole  10 mg, 1 tab  daily     HISTORY:  Past Medical History:  Past Medical History:  Diagnosis Date   Hepatitis C    10 years ago   Past Surgical History:  Social History:  reports that she has been smoking cigarettes. She has never used smokeless tobacco. She reports that she does not currently use alcohol. No history on file for drug use. Family History: No family history on file.   HOME MEDICATIONS: Allergies as of 11/03/2024   No  Known Allergies      Medication List        Accurate as of November 03, 2024  9:06 AM. If you have any questions, ask your nurse or doctor.          famotidine 20 MG tablet Commonly known as: PEPCID Take 20 mg by mouth.   methimazole  10 MG tablet Commonly known as: TAPAZOLE  Take 1 tablet (10 mg total) by mouth daily.          OBJECTIVE:   PHYSICAL EXAM: VS: BP 115/78   Pulse (!) 101   Ht 5' 4 (1.626 m)   Wt 129 lb (58.5 kg)   SpO2 99%   BMI 22.14 kg/m    EXAM: General: Pt appears well and is in NAD  Neck: General: Supple without adenopathy. Thyroid : Right thyroid  asymmetry noted,No  thyroid  bruits noted   Lungs: Clear with good BS bilat   Heart: Auscultation: RRR.  Abdomen: Soft, non tender   Extremities:  BL LE: No pretibial edema   Mental Status: Judgment, insight: Intact Orientation: Oriented to time, place, and person Mood and affect: No depression, anxiety, or agitation     DATA REVIEWED:   Latest Reference Range & Units 11/03/24 09:26  TSH mIU/L 4.23  Triiodothyronine,Free,Serum 2.3 - 4.2 pg/mL 2.8  T4,Free(Direct) 0.8 - 1.8 ng/dL 1.0      Latest Reference Range &  Units 04/20/24 10:45  AG Ratio 1.0 - 2.5 (calc) 1.8  AST 10 - 30 U/L 15  ALT 6 - 29 U/L 11  Total Protein 6.1 - 8.1 g/dL 6.8  Bilirubin, Direct 0.0 - 0.2 mg/dL 0.2  Indirect Bilirubin 0.2 - 1.2 mg/dL (calc) 0.5  Total Bilirubin 0.2 - 1.2 mg/dL 0.7  Alkaline phosphatase (APISO) 31 - 125 U/L 41  Globulin 1.9 - 3.7 g/dL (calc) 2.4  WBC 3.8 - 89.1 Thousand/uL 7.4  RBC 3.80 - 5.10 Million/uL 3.95  Hemoglobin 11.7 - 15.5 g/dL 87.4  HCT 64.9 - 54.9 % 37.4  MCV 80.0 - 100.0 fL 94.7  MCH 27.0 - 33.0 pg 31.6  MCHC 32.0 - 36.0 g/dL 66.5  RDW 88.9 - 84.9 % 12.8  Platelets 140 - 400 Thousand/uL 312  MPV 7.5 - 12.5 fL 10.0  Neutrophils % 62.3  Monocytes Relative % 5.5  Eosinophil % 0.8  Basophil % 0.7  NEUT# 1,500 - 7,800 cells/uL 4,610  Total Lymphocyte % 30.7  Eosinophils  Absolute 15 - 500 cells/uL 59  Basophils Absolute 0 - 200 cells/uL 52  Absolute Monocytes 200 - 950 cells/uL 407  Preg, Serum  NEGATIVE  TSH mIU/L 3.47  T4,Free(Direct) 0.8 - 1.8 ng/dL 1.1  Albumin MSPROF 3.6 - 5.1 g/dL 4.4  Absolute Lymphocytes 850 - 3,900 cells/uL 2,272      TRAB <=2.00 IU/L 24.64 High      Thyroid  Ultrasound 03/12/2022 Parenchymal Echotexture: Moderately heterogeneous   Isthmus: 0.8 cm   Right lobe: 6.2 x 2.5 x 3.0 cm   Left lobe: 6.8 x 2.5 x 2.3 cm   _________________________________________________________   Estimated total number of nodules >/= 1 cm: 0   Number of spongiform nodules >/=  2 cm not described below (TR1): 0   Number of mixed cystic and solid nodules >/= 1.5 cm not described below (TR2): 0   _________________________________________________________   No discrete nodules are seen within the thyroid  gland.   IMPRESSION: Enlarged, diffusely heterogeneous thyroid  without discrete nodule.      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 Graves' Disease:    -No evidence of extrathyroidal manifestation of Graves' disease - Up-to-date on eye exam   3.  Left ovarian cyst   -She had canceled her last appointment with GYN, I did encourage the patient to follow-up regarding ovarian cyst and questionable endometriosis - Menstruations are regular   F/U in 6 months     Signed electronically by: Stefano Redgie Butts, MD  [facility] Endocrinology  [facility] [address] [place] [place], [place] [address] Phone: [phone] FAX: [phone]      CC: Lonna Leonor HERO, NP [address] [place] [address] Phone: [phone]  Fax: [phone]   Return to Endocrinology clinic as below: Future Appointments  Date Time Provider Department Center  11/03/2024  9:30 AM Ariel Adams, Donell Redgie, MD [facility] None  12/28/2024 11:10 AM Skeet Juliene SAUNDERS,  DO [facility] None

## 2024-11-04 ENCOUNTER — Ambulatory Visit: Payer: Self-pay | Admitting: Internal Medicine

## 2024-11-04 LAB — T3, FREE: T3, Free: 2.8 pg/mL (ref 2.3–4.2)

## 2024-11-04 LAB — T4, FREE: Free T4: 1 ng/dL (ref 0.8–1.8)

## 2024-11-04 LAB — TSH: TSH: 4.23 m[IU]/L

## 2024-11-04 MED ORDER — METHIMAZOLE 10 MG PO TABS: 10.0000 mg | ORAL_TABLET | ORAL | 2 refills | Status: AC

## 2024-12-11 ENCOUNTER — Encounter: Payer: Self-pay | Admitting: Internal Medicine

## 2024-12-28 ENCOUNTER — Ambulatory Visit: Admitting: Neurology

## 2025-05-04 ENCOUNTER — Ambulatory Visit: Admitting: Internal Medicine
